# Patient Record
Sex: Female | Born: 2000 | Race: White | Hispanic: No | Marital: Single | Smoking: Never smoker
Health system: Southern US, Community
[De-identification: ages and names within clinical notes are randomized; demographics above are authoritative.]

## PROBLEM LIST (undated history)

## (undated) HISTORY — PX: WRIST SURGERY: SHX841

---
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
## 2019-01-08 DIAGNOSIS — Z01818 Encounter for other preprocedural examination: Secondary | ICD-10-CM | POA: Diagnosis not present

## 2019-01-19 DIAGNOSIS — S52612K Displaced fracture of left ulna styloid process, subsequent encounter for closed fracture with nonunion: Secondary | ICD-10-CM | POA: Diagnosis not present

## 2019-01-21 DIAGNOSIS — S52612K Displaced fracture of left ulna styloid process, subsequent encounter for closed fracture with nonunion: Secondary | ICD-10-CM | POA: Diagnosis not present

## 2019-01-28 DIAGNOSIS — S52612K Displaced fracture of left ulna styloid process, subsequent encounter for closed fracture with nonunion: Secondary | ICD-10-CM | POA: Diagnosis not present

## 2019-02-05 DIAGNOSIS — S52612K Displaced fracture of left ulna styloid process, subsequent encounter for closed fracture with nonunion: Secondary | ICD-10-CM | POA: Diagnosis not present

## 2019-02-11 DIAGNOSIS — S52612K Displaced fracture of left ulna styloid process, subsequent encounter for closed fracture with nonunion: Secondary | ICD-10-CM | POA: Diagnosis not present

## 2019-03-01 NOTE — Patient Instructions (Signed)
I value your feedback and entrusting us with your care. If you get a [facility] patient survey, I would appreciate you taking the time to let us know about your experience today. Thank you! 

## 2019-03-01 NOTE — Progress Notes (Signed)
System, Pcp Not In   Chief Complaint  Patient presents with  . Contraception    interested in IUD    HPI:      Ms. Crystal Cochran is a 18 y.o. No obstetric history on file. who LMP was Patient's last menstrual period was 02/09/2019 (exact date)., [facility], pt wants IUD. Menses are monthly, lasting 5-6 days, mod flow, no BTB, mild dysmen, improved with ibup. Pt did OCPs in past without side effects but wants non-daily method. Stopped them about 2/20. Pt is not currently sex active but has been in past. No recent STD testing, no hx of STDs in past.  No hx of HTN, DVTs, migraines with aura.   History reviewed. No pertinent past medical history.  Past Surgical History:  Procedure Laterality Date  . WRIST SURGERY      History reviewed. No pertinent family history.  Social History   Socioeconomic History  . Marital status: Single    Spouse name: Not on file  . Number of children: Not on file  . Years of education: Not on file  . Highest education level: Not on file  Occupational History  . Not on file  Social Needs  . Financial resource strain: Not on file  . Food insecurity    Worry: Not on file    Inability: Not on file  . Transportation needs    Medical: Not on file    Non-medical: Not on file  Tobacco Use  . Smoking status: Never Smoker  . Smokeless tobacco: Never Used  Substance and Sexual Activity  . Alcohol use: Yes  . Drug use: Never  . Sexual activity: Not Currently    Birth control/protection: None  Lifestyle  . Physical activity    Days per week: Not on file    Minutes per session: Not on file  . Stress: Not on file  Relationships  . Social Musicianconnections    Talks on phone: Not on file    Gets together: Not on file    Attends religious service: Not on file    Active member of club or organization: Not on file    Attends meetings of clubs or organizations: Not on file    Relationship status: Not on file  . Intimate partner  violence    Fear of current or ex partner: Not on file    Emotionally abused: Not on file    Physically abused: Not on file    Forced sexual activity: Not on file  Other Topics Concern  . Not on file  Social History Narrative  . Not on file    No outpatient medications prior to visit.   No facility-administered medications prior to visit.       ROS:  Review of Systems  Constitutional: Negative for fatigue, fever and unexpected weight change.  Respiratory: Negative for cough, shortness of breath and wheezing.   Cardiovascular: Negative for chest pain, palpitations and leg swelling.  Gastrointestinal: Negative for blood in stool, constipation, diarrhea, nausea and vomiting.  Endocrine: Negative for cold intolerance, heat intolerance and polyuria.  Genitourinary: Negative for dyspareunia, dysuria, flank pain, frequency, genital sores, hematuria, menstrual problem, pelvic pain, urgency, vaginal bleeding, vaginal discharge and vaginal pain.  Musculoskeletal: Negative for back pain, joint swelling and myalgias.  Skin: Negative for rash.  Neurological: Negative for dizziness, syncope, light-headedness, numbness and headaches.  Hematological: Negative for adenopathy.  Psychiatric/Behavioral: Negative for agitation, confusion, sleep disturbance and suicidal ideas. The  patient is not nervous/anxious.   BREAST: No symptoms   OBJECTIVE:   Vitals:  BP 90/60   Ht 5' 10.5" (1.791 m)   Wt 139 lb (63 kg)   LMP 02/09/2019 (Exact Date)   BMI 19.66 kg/m   Physical Exam Vitals signs reviewed.  Constitutional:      Appearance: She is well-developed.  Neck:     Musculoskeletal: Normal range of motion.  Pulmonary:     Effort: Pulmonary effort is normal.  Genitourinary:    General: Normal vulva.     Pubic Area: No rash.      Labia:        Right: No rash, tenderness or lesion.        Left: No rash, tenderness or lesion.      Vagina: Normal. No vaginal discharge, erythema or  tenderness.     Cervix: Normal.     Uterus: Normal. Not enlarged and not tender.      Adnexa: Right adnexa normal and left adnexa normal.       Right: No mass or tenderness.         Left: No mass or tenderness.    Musculoskeletal: Normal range of motion.  Skin:    General: Skin is warm and dry.  Neurological:     General: No focal deficit present.     Mental Status: She is alert and oriented to person, place, and time.  Psychiatric:        Mood and Affect: Mood normal.        Behavior: Behavior normal.        Thought Content: Thought content normal.        Judgment: Judgment normal.     Assessment/Plan: Encounter for initial prescription of intrauterine contraceptive device (IUD) - Plan: misoprostol (CYTOTEC) 100 MCG tablet; IUD discussed, Kyleena handout given. RTO with menses for insertion. Rx cytotec/NSAIDs 1 hr before appt.  Screening for STD (sexually transmitted disease) - Plan: Cervicovaginal ancillary only    Meds ordered this encounter  Medications  . misoprostol (CYTOTEC) 100 MCG tablet    Sig: Take 1 tablet (100 mcg total) by mouth once for 1 dose. 1 hour before appt    Dispense:  1 tablet    Refill:  0    Order Specific Question:   Supervising Provider    Answer:   Gae Dry [[id number]]      Return if symptoms worsen or fail to improve./with menses for IUD insertion   B. , PA-C 03/02/2019 9:53 AM

## 2019-03-02 ENCOUNTER — Other Ambulatory Visit: Payer: Self-pay

## 2019-03-02 ENCOUNTER — Encounter: Payer: Self-pay | Admitting: Obstetrics and Gynecology

## 2019-03-02 ENCOUNTER — Other Ambulatory Visit (HOSPITAL_COMMUNITY)
Admission: RE | Admit: 2019-03-02 | Discharge: 2019-03-02 | Disposition: A | Discharge status: Home / Self Care | Payer: BC Managed Care – PPO | Source: Ambulatory Visit | Attending: Obstetrics and Gynecology | Admitting: Obstetrics and Gynecology

## 2019-03-02 ENCOUNTER — Ambulatory Visit (INDEPENDENT_AMBULATORY_CARE_PROVIDER_SITE_OTHER): Payer: BC Managed Care – PPO | Admitting: Obstetrics and Gynecology

## 2019-03-02 VITALS — BP 90/60 | Ht 70.5 in | Wt 139.0 lb

## 2019-03-02 DIAGNOSIS — Z113 Encounter for screening for infections with a predominantly sexual mode of transmission: Secondary | ICD-10-CM

## 2019-03-02 DIAGNOSIS — Z30014 Encounter for initial prescription of intrauterine contraceptive device: Secondary | ICD-10-CM

## 2019-03-02 MED ORDER — MISOPROSTOL 100 MCG PO TABS: 100.0000 ug | ORAL_TABLET | Freq: Once | ORAL | 0 refills | Status: DC

## 2019-03-04 LAB — CERVICOVAGINAL ANCILLARY ONLY
Chlamydia: NEGATIVE
Neisseria Gonorrhea: NEGATIVE

## 2019-03-10 DIAGNOSIS — S52615K Nondisplaced fracture of left ulna styloid process, subsequent encounter for closed fracture with nonunion: Secondary | ICD-10-CM | POA: Diagnosis not present

## 2019-03-10 DIAGNOSIS — M6281 Muscle weakness (generalized): Secondary | ICD-10-CM | POA: Diagnosis not present

## 2019-03-10 DIAGNOSIS — M25532 Pain in left wrist: Secondary | ICD-10-CM | POA: Diagnosis not present

## 2019-03-11 ENCOUNTER — Telehealth: Payer: Self-pay | Admitting: Obstetrics and Gynecology

## 2019-03-11 NOTE — Telephone Encounter (Signed)
Patient is schedule for Crystal Cochran insertion 03/15/19 with [facility]

## 2019-03-13 NOTE — Progress Notes (Signed)
   Chief Complaint  Patient presents with  . Contraception    Kyleena insertion     IUD PROCEDURE NOTE:  Crystal Cochran is a 18 y.o. [id number] here for [facility]  IUD insertion for [facility].  BP 120/70   Ht 5\' 11"  (1.803 m)   Wt 145 lb (65.8 kg)   LMP 03/11/2019 (Exact Date)   BMI 20.22 kg/m   IUD Insertion Procedure Note Patient identified, informed consent performed, consent signed.   Discussed risks of irregular bleeding, cramping, infection, malpositioning or misplacement of the IUD outside the uterus which may require further procedure such as laparoscopy, risk of failure <1%. Time out was performed.    Speculum placed in the vagina.  Cervix visualized.  Cleaned with Betadine x 2.  Grasped anteriorly with a single tooth tenaculum.  Uterus sounded to 7.0 cm.   IUD placed per manufacturer's recommendations.  Strings trimmed to 3 cm. Tenaculum was removed, good hemostasis noted.  Patient tolerated procedure well.   ASSESSMENT:  Encounter for IUD insertion - Plan: Levonorgestrel (KYLEENA) 19.5 MG IUD   Meds ordered this encounter  Medications  . Levonorgestrel (KYLEENA) 19.5 MG IUD    Sig: 1 each (19.5 mg total) by Intrauterine route once for 1 dose.    Dispense:  1 Intra Uterine Device    Refill:  0    Order Specific Question:   Supervising Provider    Answer:   Gae Dry [[id number]]     Plan:  Patient was given post-procedure instructions.  She was advised to have backup contraception for one week.   Call if you are having increasing pain, cramps or bleeding or if you have a fever greater than 100.4 degrees F., shaking chills, nausea or vomiting. Patient was also asked to check IUD strings periodically and follow up in 4 weeks for IUD check.  Return in about 4 weeks (around 04/12/2019).  Alicia B. Copland, PA-C 03/15/2019 11:17 AM

## 2019-03-15 ENCOUNTER — Encounter: Payer: Self-pay | Admitting: Obstetrics and Gynecology

## 2019-03-15 ENCOUNTER — Other Ambulatory Visit: Payer: Self-pay

## 2019-03-15 ENCOUNTER — Ambulatory Visit (INDEPENDENT_AMBULATORY_CARE_PROVIDER_SITE_OTHER): Payer: BC Managed Care – PPO | Admitting: Obstetrics and Gynecology

## 2019-03-15 VITALS — BP 120/70 | Ht 71.0 in | Wt 145.0 lb

## 2019-03-15 DIAGNOSIS — Z30430 Encounter for insertion of intrauterine contraceptive device: Secondary | ICD-10-CM | POA: Diagnosis not present

## 2019-03-15 MED ORDER — KYLEENA 19.5 MG IU IUD: 19.5000 mg | INTRAUTERINE_SYSTEM | Freq: Once | INTRAUTERINE | 0 refills | Status: DC

## 2019-03-15 NOTE — Patient Instructions (Signed)
I value your feedback and entrusting us with your care. If you get a [facility] patient survey, I would appreciate you taking the time to let us know about your experience today. Thank you!  [facility] [phone]  Instructions after IUD insertion  Most women experience no significant problems after insertion of an IUD, however minor cramping and spotting for a few days is common. Cramps may be treated with ibuprofen 800mg every 8 hours or Tylenol 650 mg every 4 hours. Contact [facility] immediately if you experience any of the following symptoms during the next week: temperature >99.6 degrees, worsening pelvic pain, abdominal pain, fainting, unusually heavy vaginal bleeding, foul vaginal discharge, or if you think you have expelled the IUD.  Nothing inserted in the vagina for 48 hours. You will be scheduled for a follow up visit in approximately four weeks.  You should check monthly to be sure you can feel the IUD strings in the upper vagina. If you are having a monthly period, try to check after each period. If you cannot feel the IUD strings,  contact [facility] immediately so we can do an exam to determine if the IUD has been expelled.   Please use backup protection until we can confirm the IUD is in place.  Call [facility] if you are exposed to or diagnosed with a sexually transmitted infection, as we will need to discuss whether it is safe for you to continue using an IUD.   

## 2019-03-15 NOTE — Telephone Encounter (Signed)
Noted. Kyleena provided for this patient °

## 2019-03-16 DIAGNOSIS — S52615K Nondisplaced fracture of left ulna styloid process, subsequent encounter for closed fracture with nonunion: Secondary | ICD-10-CM | POA: Diagnosis not present

## 2019-03-16 DIAGNOSIS — M6281 Muscle weakness (generalized): Secondary | ICD-10-CM | POA: Diagnosis not present

## 2019-03-16 DIAGNOSIS — M25561 Pain in right knee: Secondary | ICD-10-CM | POA: Diagnosis not present

## 2019-03-16 DIAGNOSIS — M25532 Pain in left wrist: Secondary | ICD-10-CM | POA: Diagnosis not present

## 2019-03-18 DIAGNOSIS — S52615K Nondisplaced fracture of left ulna styloid process, subsequent encounter for closed fracture with nonunion: Secondary | ICD-10-CM | POA: Diagnosis not present

## 2019-03-18 DIAGNOSIS — M6281 Muscle weakness (generalized): Secondary | ICD-10-CM | POA: Diagnosis not present

## 2019-03-18 DIAGNOSIS — M25532 Pain in left wrist: Secondary | ICD-10-CM | POA: Diagnosis not present

## 2019-03-18 DIAGNOSIS — M25561 Pain in right knee: Secondary | ICD-10-CM | POA: Diagnosis not present

## 2019-03-22 ENCOUNTER — Encounter
Admission: EM | Disposition: A | Discharge status: Home / Self Care | Payer: Self-pay | Source: Home / Self Care | Attending: Student in an Organized Health Care Education/Training Program

## 2019-03-22 ENCOUNTER — Other Ambulatory Visit: Payer: Self-pay

## 2019-03-22 ENCOUNTER — Ambulatory Visit
Admission: EM | Admit: 2019-03-22 | Discharge: 2019-03-22 | Disposition: A | Discharge status: Home / Self Care | Payer: BC Managed Care – PPO | Attending: Student in an Organized Health Care Education/Training Program | Admitting: Student in an Organized Health Care Education/Training Program

## 2019-03-22 ENCOUNTER — Emergency Department: Payer: BC Managed Care – PPO | Admitting: Anesthesiology

## 2019-03-22 ENCOUNTER — Emergency Department: Payer: BC Managed Care – PPO

## 2019-03-22 DIAGNOSIS — T18108A Unspecified foreign body in esophagus causing other injury, initial encounter: Secondary | ICD-10-CM | POA: Diagnosis not present

## 2019-03-22 DIAGNOSIS — R0989 Other specified symptoms and signs involving the circulatory and respiratory systems: Secondary | ICD-10-CM

## 2019-03-22 DIAGNOSIS — Z793 Long term (current) use of hormonal contraceptives: Secondary | ICD-10-CM | POA: Diagnosis not present

## 2019-03-22 DIAGNOSIS — Z20828 Contact with and (suspected) exposure to other viral communicable diseases: Secondary | ICD-10-CM | POA: Diagnosis not present

## 2019-03-22 DIAGNOSIS — T18198A Other foreign object in esophagus causing other injury, initial encounter: Secondary | ICD-10-CM | POA: Diagnosis not present

## 2019-03-22 DIAGNOSIS — T18128A Food in esophagus causing other injury, initial encounter: Secondary | ICD-10-CM | POA: Diagnosis not present

## 2019-03-22 DIAGNOSIS — X58XXXA Exposure to other specified factors, initial encounter: Secondary | ICD-10-CM | POA: Insufficient documentation

## 2019-03-22 HISTORY — PX: FOREIGN BODY REMOVAL ESOPHAGEAL: SHX5322

## 2019-03-22 HISTORY — PX: RIGID ESOPHAGOSCOPY: SHX5226

## 2019-03-22 LAB — SARS CORONAVIRUS 2 BY RT PCR (HOSPITAL ORDER, PERFORMED IN ~~LOC~~ HOSPITAL LAB): SARS Coronavirus 2: NEGATIVE

## 2019-03-22 LAB — POCT PREGNANCY, URINE: Preg Test, Ur: NEGATIVE

## 2019-03-22 SURGERY — ESOPHAGOSCOPY, RIGID
Anesthesia: General

## 2019-03-22 MED ORDER — SUCCINYLCHOLINE CHLORIDE 20 MG/ML IJ SOLN
INTRAMUSCULAR | Status: DC | PRN
  Administered 2019-03-22: 140 mg via INTRAVENOUS

## 2019-03-22 MED ORDER — LIDOCAINE VISCOUS HCL 2 % MT SOLN
15.0000 mL | Freq: Once | OROMUCOSAL | Status: AC
  Administered 2019-03-22: 09:00:00 15 mL via OROMUCOSAL
  Filled 2019-03-22: qty 15

## 2019-03-22 MED ORDER — DEXAMETHASONE SODIUM PHOSPHATE 10 MG/ML IJ SOLN
INTRAMUSCULAR | Status: DC | PRN
  Administered 2019-03-22: 10 mg via INTRAVENOUS

## 2019-03-22 MED ORDER — PROPOFOL 10 MG/ML IV BOLUS
INTRAVENOUS | Status: AC
  Filled 2019-03-22: qty 20

## 2019-03-22 MED ORDER — MORPHINE SULFATE (PF) 4 MG/ML IV SOLN
4.0000 mg | INTRAVENOUS | Status: DC | PRN
  Administered 2019-03-22: 4 mg via INTRAVENOUS
  Filled 2019-03-22: qty 1

## 2019-03-22 MED ORDER — ROCURONIUM BROMIDE 100 MG/10ML IV SOLN
INTRAVENOUS | Status: DC | PRN
  Administered 2019-03-22: 10 mg via INTRAVENOUS
  Administered 2019-03-22: 30 mg via INTRAVENOUS

## 2019-03-22 MED ORDER — FENTANYL CITRATE (PF) 100 MCG/2ML IJ SOLN
INTRAMUSCULAR | Status: DC | PRN
  Administered 2019-03-22 (×2): 50 ug via INTRAVENOUS

## 2019-03-22 MED ORDER — SUGAMMADEX SODIUM 200 MG/2ML IV SOLN
INTRAVENOUS | Status: DC | PRN
  Administered 2019-03-22: 118 mg via INTRAVENOUS

## 2019-03-22 MED ORDER — ONDANSETRON HCL 4 MG/2ML IJ SOLN
4.0000 mg | Freq: Once | INTRAMUSCULAR | Status: AC
  Administered 2019-03-22: 4 mg via INTRAVENOUS
  Filled 2019-03-22: qty 2

## 2019-03-22 MED ORDER — ONDANSETRON HCL 4 MG/2ML IJ SOLN: 4.0000 mg | Freq: Once | INTRAMUSCULAR | Status: DC | PRN

## 2019-03-22 MED ORDER — LIDOCAINE HCL (CARDIAC) PF 100 MG/5ML IV SOSY
PREFILLED_SYRINGE | INTRAVENOUS | Status: DC | PRN
  Administered 2019-03-22: 80 mg via INTRAVENOUS

## 2019-03-22 MED ORDER — ONDANSETRON HCL 4 MG/2ML IJ SOLN
INTRAMUSCULAR | Status: DC | PRN
  Administered 2019-03-22: 4 mg via INTRAVENOUS

## 2019-03-22 MED ORDER — PROPOFOL 10 MG/ML IV BOLUS
INTRAVENOUS | Status: DC | PRN
  Administered 2019-03-22: 150 mg via INTRAVENOUS

## 2019-03-22 MED ORDER — FENTANYL CITRATE (PF) 100 MCG/2ML IJ SOLN
INTRAMUSCULAR | Status: AC
  Administered 2019-03-22: 25 ug via INTRAVENOUS
  Filled 2019-03-22: qty 2

## 2019-03-22 MED ORDER — LIDOCAINE HCL (PF) 2 % IJ SOLN
INTRAMUSCULAR | Status: AC
  Filled 2019-03-22: qty 10

## 2019-03-22 MED ORDER — MIDAZOLAM HCL 2 MG/2ML IJ SOLN
INTRAMUSCULAR | Status: DC | PRN
  Administered 2019-03-22: 2 mg via INTRAVENOUS

## 2019-03-22 MED ORDER — FENTANYL CITRATE (PF) 100 MCG/2ML IJ SOLN
INTRAMUSCULAR | Status: AC
  Filled 2019-03-22: qty 2

## 2019-03-22 MED ORDER — FENTANYL CITRATE (PF) 100 MCG/2ML IJ SOLN
25.0000 ug | INTRAMUSCULAR | Status: DC | PRN
  Administered 2019-03-22 (×2): 25 ug via INTRAVENOUS

## 2019-03-22 MED ORDER — LACTATED RINGERS IV SOLN
INTRAVENOUS | Status: DC
  Administered 2019-03-22: 12:00:00 via INTRAVENOUS

## 2019-03-22 MED ORDER — MIDAZOLAM HCL 2 MG/2ML IJ SOLN
INTRAMUSCULAR | Status: AC
  Filled 2019-03-22: qty 2

## 2019-03-22 SURGICAL SUPPLY — 18 items
BASIN GRAD PLASTIC 32OZ STRL (MISCELLANEOUS) ×2 IMPLANT
CANISTER SUCT 1200ML W/VALVE (MISCELLANEOUS) ×4 IMPLANT
CNTNR SPEC 2.5X3XGRAD LEK (MISCELLANEOUS) ×2
CONT SPEC 4OZ STER OR WHT (MISCELLANEOUS) ×2
CONTAINER SPEC 2.5X3XGRAD LEK (MISCELLANEOUS) ×2 IMPLANT
COVER BACK TABLE REUSABLE LG (DRAPES) ×4 IMPLANT
COVER WAND RF STERILE (DRAPES) ×4 IMPLANT
DRAPE 3/4 80X56 (DRAPES) ×4 IMPLANT
GAUZE 4X4 16PLY RFD (DISPOSABLE) ×4 IMPLANT
GLOVE PROTEXIS LATEX SZ 7.5 (GLOVE) ×4 IMPLANT
GLOVE SURG LATEX 7.5 PF (GLOVE) ×2 IMPLANT
GOWN STRL REUS W/ TWL LRG LVL3 (GOWN DISPOSABLE) ×2 IMPLANT
GOWN STRL REUS W/TWL LRG LVL3 (GOWN DISPOSABLE) ×2
KIT TURNOVER KIT A (KITS) ×4 IMPLANT
SURGILUBE 2OZ TUBE FLIPTOP (MISCELLANEOUS) ×4 IMPLANT
TOWEL OR 17X26 4PK STRL BLUE (TOWEL DISPOSABLE) ×4 IMPLANT
TUBING CONNECTING 10 (TUBING) ×3 IMPLANT
TUBING CONNECTING 10' (TUBING) ×1

## 2019-03-22 NOTE — ED Triage Notes (Signed)
Patient took herbal supplement pill at 12 and it lodged in her throat. Patient reports that it has not moved and complains of pain to site. Patient able to speak in full sentences, however her voice is hoarse.

## 2019-03-22 NOTE — ED Notes (Signed)
4 oz ginger ale given to patient. 

## 2019-03-22 NOTE — Transfer of Care (Signed)
Immediate Anesthesia Transfer of Care Note  Patient: Crystal Cochran  Procedure(s) Performed: RIGID ESOPHAGOSCOPY (N/A ) REMOVAL FOREIGN BODY ESOPHAGEAL  Patient Location: PACU  Anesthesia Type:General  Level of Consciousness: awake and sedated  Airway & Oxygen Therapy: Patient Spontanous Breathing and Patient connected to face mask oxygen  Post-op Assessment: Report given to RN and Post -op Vital signs reviewed and stable  Post vital signs: Reviewed and stable  Last Vitals:  Vitals Value Taken Time  BP    Temp    Pulse 66 03/22/19 1249  Resp 10 03/22/19 1249  SpO2 100 % 03/22/19 1249  Vitals shown include unvalidated device data.  Last Pain:  Vitals:   03/22/19 1133  TempSrc: Tympanic  PainSc: 7          Complications: No apparent anesthesia complications

## 2019-03-22 NOTE — Anesthesia Procedure Notes (Signed)
Procedure Name: Intubation Date/Time: 03/22/2019 12:24 PM Performed by: Allean Found, CRNA Pre-anesthesia Checklist: Patient identified, Patient being monitored, Timeout performed, Emergency Drugs available and Suction available Patient Re-evaluated:Patient Re-evaluated prior to induction Oxygen Delivery Method: Circle system utilized Preoxygenation: Pre-oxygenation with 100% oxygen Induction Type: IV induction Ventilation: Mask ventilation without difficulty Laryngoscope Size: Mac, 3 and McGraph Grade View: Grade I Tube type: Oral Tube size: 7.0 mm Number of attempts: 1 Airway Equipment and Method: Stylet Placement Confirmation: ETT inserted through vocal cords under direct vision,  positive ETCO2 and breath sounds checked- equal and bilateral Secured at: 21 cm Tube secured with: Tape Dental Injury: Teeth and Oropharynx as per pre-operative assessment

## 2019-03-22 NOTE — Anesthesia Post-op Follow-up Note (Signed)
Anesthesia QCDR form completed.        

## 2019-03-22 NOTE — Anesthesia Preprocedure Evaluation (Signed)
Anesthesia Evaluation  Patient identified by MRN, date of birth, ID band Patient awake    Reviewed: Allergy & Precautions, H&P , NPO status , Patient's Chart, lab work & pertinent test results, reviewed documented beta blocker date and time   Airway Mallampati: II  TM Distance: >3 FB Neck ROM: full    Dental  (+) Teeth Intact   Pulmonary neg pulmonary ROS,    Pulmonary exam normal        Cardiovascular Exercise Tolerance: Good negative cardio ROS Normal cardiovascular exam Rhythm:regular Rate:Normal     Neuro/Psych negative neurological ROS  negative psych ROS   GI/Hepatic negative GI ROS, Neg liver ROS,   Endo/Other  negative endocrine ROS  Renal/GU negative Renal ROS  negative genitourinary   Musculoskeletal   Abdominal   Peds  Hematology negative hematology ROS (+)   Anesthesia Other Findings History reviewed. No pertinent past medical history. Past Surgical History: No date: WRIST SURGERY BMI    Body Mass Index: 18.13 kg/m     Reproductive/Obstetrics negative OB ROS                             Anesthesia Physical Anesthesia Plan  ASA: II and emergent  Anesthesia Plan: General ETT   Post-op Pain Management:    Induction:   PONV Risk Score and Plan:   Airway Management Planned:   Additional Equipment:   Intra-op Plan:   Post-operative Plan:   Informed Consent: I have reviewed the patients History and Physical, chart, labs and discussed the procedure including the risks, benefits and alternatives for the proposed anesthesia with the patient or authorized representative who has indicated his/her understanding and acceptance.     Dental Advisory Given  Plan Discussed with: CRNA  Anesthesia Plan Comments:         Anesthesia Quick Evaluation

## 2019-03-22 NOTE — ED Notes (Signed)
Tolerating po well.  Applesauce given to patient.

## 2019-03-22 NOTE — ED Notes (Signed)
Drinking ginger ale.  Continues to c/o pain when swallowing, but states she feels like the pill has moved a little.  Continues to manage secretions.  Voice strong.  Respirations regular and non labored.

## 2019-03-22 NOTE — Discharge Instructions (Signed)

## 2019-03-22 NOTE — H&P (Signed)
Crystal Cochran is an 18 y.o. female.   Chief Complaint: Pill stuck in her throat HPI: Patient is a 18 year old white female who is been very healthy in general.  She said sometimes were occasionally food would feel like it sticks a little bit in her throat with solids going down.  She ate a very large herbal supplement last night that is a huge pill.  This got stuck in her cervical esophagus and has not moved.  She tried some liquids this morning and could not get it to pass.  CT scan showed a mass in her cervical esophagus.  History reviewed. No pertinent past medical history.  Past Surgical History:  Procedure Laterality Date  . WRIST SURGERY      History reviewed. No pertinent family history. Social History:  reports that she has never smoked. She has never used smokeless tobacco. She reports current alcohol use. She reports that she does not use drugs.  Allergies: No Known Allergies  Medications Prior to Admission  Medication Sig Dispense Refill  . Levonorgestrel (KYLEENA) 19.5 MG IUD 1 each (19.5 mg total) by Intrauterine route once for 1 dose. 1 Intra Uterine Device 0    Results for orders placed or performed during the hospital encounter of 03/22/19 (from the past 48 hour(s))  SARS Coronavirus 2 [facility] order, Performed in [facility] hospital lab) Nasopharyngeal Nasopharyngeal Swab     Status: None   Collection Time: 03/22/19  7:27 AM   Specimen: Nasopharyngeal Swab  Result Value Ref Range   SARS Coronavirus 2 NEGATIVE NEGATIVE    Comment: (NOTE) If result is NEGATIVE SARS-CoV-2 target nucleic acids are NOT DETECTED. The SARS-CoV-2 RNA is generally detectable in upper and lower  respiratory specimens during the acute phase of infection. The lowest  concentration of SARS-CoV-2 viral copies this assay can detect is 250  copies / mL. A negative result does not preclude SARS-CoV-2 infection  and should not be used as the sole basis for treatment or other  patient management  decisions.  A negative result may occur with  improper specimen collection / handling, submission of specimen other  than nasopharyngeal swab, presence of viral mutation(s) within the  areas targeted by this assay, and inadequate number of viral copies  (<250 copies / mL). A negative result must be combined with clinical  observations, patient history, and epidemiological information. If result is POSITIVE SARS-CoV-2 target nucleic acids are DETECTED. The SARS-CoV-2 RNA is generally detectable in upper and lower  respiratory specimens dur ing the acute phase of infection.  Positive  results are indicative of active infection with SARS-CoV-2.  Clinical  correlation with patient history and other diagnostic information is  necessary to determine patient infection status.  Positive results do  not rule out bacterial infection or co-infection with other viruses. If result is PRESUMPTIVE POSTIVE SARS-CoV-2 nucleic acids MAY BE PRESENT.   A presumptive positive result was obtained on the submitted specimen  and confirmed on repeat testing.  While 2019 novel coronavirus  (SARS-CoV-2) nucleic acids may be present in the submitted sample  additional confirmatory testing may be necessary for epidemiological  and / or clinical management purposes  to differentiate between  SARS-CoV-2 and other Sarbecovirus currently known to infect humans.  If clinically indicated additional testing with an alternate test  methodology [id number]) is advised. The SARS-CoV-2 RNA is generally  detectable in upper and lower respiratory sp ecimens during the acute  phase of infection. The expected result is Negative. Fact Sheet for  Patients:  BoilerBrush.com.cyhttps://www.fda.gov/media/136312/download Fact Sheet for Healthcare Providers: https://pope.com/https://www.fda.gov/media/136313/download This test is not yet approved or cleared by the [place] FDA and has been authorized for detection and/or diagnosis of SARS-CoV-2 by FDA under an  Emergency Use Authorization (EUA).  This EUA will remain in effect (meaning this test can be used) for the duration of the COVID-19 declaration under Section 564(b)(1) of the Act, 21 U.S.C. section 360bbb-3(b)(1), unless the authorization is terminated or revoked sooner. Performed at [facility], [address] [place], [place] [address]   Pregnancy, urine POC     Status: None   Collection Time: 03/22/19 10:51 AM  Result Value Ref Range   Preg Test, Ur NEGATIVE NEGATIVE    Comment:        THE SENSITIVITY OF THIS METHODOLOGY IS >24 mIU/mL    Dg Neck Soft Tissue  Result Date: 03/22/2019 CLINICAL DATA:  Pill lodged in throat. EXAM: NECK SOFT TISSUES - 1+ VIEW COMPARISON:  No prior. FINDINGS: No swelling noted of the epiglottis or retropharyngeal space. No radiopaque foreign body. Tiny amount of air noted about the cervical esophagus, this may be within the esophagus. Paraesophageal air cannot be excluded. Trachea is widely patent. Pulmonary apices are clear. Loss of normal cervical lordosis. No acute bony abnormality. IMPRESSION: 1.  No radiopaque foreign body. 2. Tiny amount of air noted about the cervical esophagus, although this may be within the esophagus, paraesophageal air cannot be excluded. To exclude cervical esophageal perforation nonenhanced neck CT can be obtained. These results were called by telephone at the time of interpretation on 03/22/2019 at 6:30 am to provider [facility] , who verbally acknowledged these results. Electronically Signed   By: Maisie Fushomas  Register   On: 03/22/2019 06:29   Ct Soft Tissue Neck Wo Contrast  Result Date: 03/22/2019 CLINICAL DATA:  Ingested foreign body with possible esophageal perforation on x-ray. EXAM: CT NECK WITHOUT CONTRAST TECHNIQUE: Multidetector CT imaging of the neck was performed following the standard protocol without intravenous contrast. COMPARISON:  Earlier today FINDINGS: Pharynx and larynx: Lamellated high-density foreign  body in the upper cervical esophagus, just below the verge, measuring 17 x 19 x 7 mm. Location is un moved from prior radiograph. No perforation or adjacent mural thickening. No edema seen within the pharynx or larynx. Salivary glands: Normal Thyroid: Normal Lymph nodes: None enlarged or abnormal density. Vascular: Negative. Limited intracranial: Negative. Visualized orbits: Negative. Mastoids and visualized paranasal sinuses: Minimal paranasal sinus opacification at the level of the ethmoids. Skeleton: Negative Upper chest: Negative IMPRESSION: 19 x 17 x 7 mm foreign body in the upper cervical esophagus without perforation or esophageal thickening. The foreign body shows a stone-like high-density and lamellated appearance. Electronically Signed   By: Marnee SpringJonathon  Watts M.D.   On: 03/22/2019 07:30    ROS   Blood pressure 118/65, pulse 65, temperature (!) 97.5 F (36.4 C), temperature source Tympanic, resp. rate 18, height 5\' 11"  (1.803 m), weight 59 kg, last menstrual period 03/11/2019, SpO2 99 %. Physical Exam her oropharynx shows no oral lesions.  The neck is negative for any masses.  She has no thyromegaly.  Her lungs are clear to auscultation.  Her heart shows a regular rate and rhythm.  Her abdomen is benign.  Her extremities are without deformity.  Assessment/Plan She has esophageal obstruction from a very large pill that has not resolved.  We will plan an esophagoscopy and removal of foreign body.  This may break into pieces and slide down which would  be sufficient as well.  The patient understands the procedure and the potential risks.  She had some liquids earlier this morning and tried little applesauce to get it to slide.  It is been approximately 2 hours since she last took any liquids in.  This increases the risk of anesthesia for possible aspiration but I feel it is important to proceed ahead and an emergent basis to try to get this removed.  The longer the pill stays there and dissolves the  more irritation to the mucosa and swelling that will occur.  I think the risk of aspiration is very minimal compared to the risk to the esophageal mucosa.  I have discussed this with the patient and we will proceed with general anesthesia and esophagoscopy for removal of the foreign body.  She has no further questions.  Informed surgical request is signed.  Cammy Copa, MD 03/22/2019, 11:46 AM

## 2019-03-22 NOTE — ED Provider Notes (Signed)
[facility] Emergency Department Provider Note  ____________________________________________   First MD Initiated Contact with Patient 03/22/19 [phone]     (approximate)  I have reviewed the triage vital signs and the nursing notes.   HISTORY  Chief Complaint Foreign Body (throat)    HPI Sequoyah Piester is a 18 y.o. female with no chronic medical issues who is generally healthy and active and a Consulting civil engineer at [employer].   She presents for evaluation of acute onset discomfort and difficulty swallowing after taking an herbal supplement at about midnight.  She said that is since she took it she felt it lodged in her throat and is remained stable and constant since that time.  Her voice is little bit hoarse and she is able to swallow her secretions but it is painful to do so.  She reports that the pain is anywhere from moderate to severe and she is able to lay flat but she feels like she has to sit up and spit out her saliva at times to keep from swallowing it.  Nothing particular makes her symptoms better.  She denies any pain down into her chest and was completely asymptomatic prior to taking the supplement.  No contact with known COVID-19 patients.  She denies fever/chills, chest pain, shortness of breath, cough, nausea, vomiting, and abdominal pain.  She has had no swelling of the anterior part of her neck.  Denies drug use.        History reviewed. No pertinent past medical history.  There are no active problems to display for this patient.   Past Surgical History:  Procedure Laterality Date  . WRIST SURGERY      Prior to Admission medications   Medication Sig Start Date End Date Taking? Authorizing Provider  Levonorgestrel (KYLEENA) 19.5 MG IUD 1 each (19.5 mg total) by Intrauterine route once for 1 dose. 03/15/19 03/15/19  Copland, Ilona Sorrel, PA-C    Allergies Patient has no known allergies.  No family history on file.  Social History Social History    Tobacco Use  . Smoking status: Never Smoker  . Smokeless tobacco: Never Used  Substance Use Topics  . Alcohol use: Yes  . Drug use: Never    Review of Systems Constitutional: No fever/chills Eyes: No visual changes. ENT: Sore throat and foreign body sensation. Cardiovascular: Denies chest pain. Respiratory: Denies shortness of breath. Gastrointestinal: No abdominal pain.  No nausea, no vomiting.  No diarrhea.  No constipation. Genitourinary: Negative for dysuria. Musculoskeletal: Negative for neck pain.  Negative for back pain. Integumentary: Negative for rash. Neurological: Negative for headaches, focal weakness or numbness.   ____________________________________________   PHYSICAL EXAM:  VITAL SIGNS: ED Triage Vitals  Enc Vitals Group     BP 03/22/19 0542 125/79     Pulse Rate 03/22/19 0542 87     Resp 03/22/19 0542 16     Temp 03/22/19 0542 98.1 F (36.7 C)     Temp src --      SpO2 03/22/19 0542 100 %     Weight 03/22/19 0538 59 kg (130 lb)     Height 03/22/19 0538 1.803 m (5\' 11" )     Head Circumference --      Peak Flow --      Pain Score 03/22/19 0538 9     Pain Loc --      Pain Edu? --      Excl. in GC? --     Constitutional: Alert and oriented.  Generally well-appearing but does appear uncomfortable. Eyes: Conjunctivae are normal.  Head: Atraumatic. Nose: No congestion/rhinnorhea. Mouth/Throat: Mucous membranes are moist and nonerythematous.  No obvious foreign bodies or evidence of acute abnormality on visual inspection. Neck: No stridor.  No meningeal signs.  No anterior neck swelling nor crepitus.  No tenderness to manipulation of the larynx. Cardiovascular: Normal rate, regular rhythm. Good peripheral circulation. Grossly normal heart sounds. Respiratory: Normal respiratory effort.  No retractions. Gastrointestinal: Soft and nontender. No distention.  Musculoskeletal: No lower extremity tenderness nor edema. No gross deformities of extremities.  Neurologic:  Normal speech and language. No gross focal neurologic deficits are appreciated.  Skin:  Skin is warm, dry and intact. Psychiatric: Mood and affect are normal. Speech and behavior are normal.  ____________________________________________   LABS (all labs ordered are listed, but only abnormal results are displayed)  Labs Reviewed - No data to display ____________________________________________  EKG  No indication for EKG ____________________________________________  RADIOLOGY I, Hinda Kehr, personally discussed these images and results by phone with the on-call radiologist and used this discussion as part of my medical decision making.    ED MD interpretation: Small area of air around cervical esophagus concerning for possible esophageal perforation.  The radiologist called to discuss the case with me and recommended following up with a noncontrast soft tissue neck CT scan.  CT pending at the time of transfer of care to Dr. Quentin Cornwall.  Official radiology report(s): Dg Neck Soft Tissue  Result Date: 03/22/2019 CLINICAL DATA:  Pill lodged in throat. EXAM: NECK SOFT TISSUES - 1+ VIEW COMPARISON:  No prior. FINDINGS: No swelling noted of the epiglottis or retropharyngeal space. No radiopaque foreign body. Tiny amount of air noted about the cervical esophagus, this may be within the esophagus. Paraesophageal air cannot be excluded. Trachea is widely patent. Pulmonary apices are clear. Loss of normal cervical lordosis. No acute bony abnormality. IMPRESSION: 1.  No radiopaque foreign body. 2. Tiny amount of air noted about the cervical esophagus, although this may be within the esophagus, paraesophageal air cannot be excluded. To exclude cervical esophageal perforation nonenhanced neck CT can be obtained. These results were called by telephone at the time of interpretation on 03/22/2019 at 6:30 am to provider [facility] , who verbally acknowledged these results. Electronically  Signed   By: Marcello Moores  Register   On: 03/22/2019 06:29    ____________________________________________   PROCEDURES   Procedure(s) performed (including Critical Care):  Procedures   ____________________________________________   INITIAL IMPRESSION / MDM / [place] / ED COURSE  As part of my medical decision making, I reviewed the following data within the [place] notes reviewed and incorporated, Patient signed out to Dr. Quentin Cornwall, Discussed with radiologist and Notes from prior ED visits   Differential diagnosis includes, but is not limited to, globus sensation, retained foreign body in the esophagus or trachea, esophageal perforation.  The patient is handling her secretions but is obviously uncomfortable.  Vital signs are stable.  No indication for emergent airway intervention.  I ordered soft tissue neck x-rays and the radiologist called me to say that there is an area of air in the tissue that is concerning for possible esophageal perforation.  He recommended a noncontrast neck CT for further evaluation.  I updated the patient.  No indication for labs at this time until we have a better idea if this is a benign finding or if it represents an emergency.  Transferring ED care to Dr. Quentin Cornwall  to follow-up the CT scan and disposition the patient appropriately.          ____________________________________________  FINAL CLINICAL IMPRESSION(S) / ED DIAGNOSES  Final diagnoses:  Sensation of foreign body in throat     MEDICATIONS GIVEN DURING THIS VISIT:  Medications - No data to display   ED Discharge Orders    None      *Please note:  Kathryne ErikssonSmythe Narayan was evaluated in Emergency Department on 03/22/2019 for the symptoms described in the history of present illness. She was evaluated in the context of the global COVID-19 pandemic, which necessitated consideration that the patient might be at risk for infection with the SARS-CoV-2 virus  that causes COVID-19. Institutional protocols and algorithms that pertain to the evaluation of patients at risk for COVID-19 are in a state of rapid change based on information released by regulatory bodies including the CDC and federal and state organizations. These policies and algorithms were followed during the patient's care in the ED.  Some ED evaluations and interventions may be delayed as a result of limited staffing during the pandemic.*  Note:  This document was prepared using Dragon voice recognition software and may include unintentional dictation errors.   Loleta RoseForbach, Eryanna Regal, MD 03/22/19 [phone]

## 2019-03-22 NOTE — Op Note (Signed)
03/22/2019  12:47 PM    Crystal Cochran  [id number]   Pre-Op Dx: Foreign body of the esophagus causing obstruction  Post-op Dx: Foreign body the esophagus because obstruction and mucosal burn  Proc: Rigid esophagoscopy for removal of foreign body  Surg:  Huey Romans  Anes:  GOT  EBL: None  Comp: None  Findings: Most of the pill had dissolved but the mucosa was quite irritated all the way around the esophagus with some residue the pill left over that was suctioned away.  Procedure: The patient was given general anesthesia by oral endotracheal intubation.  A short 10 x 14 mm esophagoscope was used for direct visualization of the esophagus.  A upper tooth guard was placed in the esophagoscope was placed into the mouth and into the back of the throat.  There is no signs of irritation here.  I went behind the arytenoids into the upper esophagus.  The sphincter was normal.  Passed out approximately 3 more centimeters and you could see pill residue in the opening of the esophagus.  I suctioned some of this way and you could see mucosal irritation where the residue was sitting against the mucosa.  I went down to the level of mucosal irritation so I could see distally.  The mucosa further down was clear.  I chose not to push through this irritated mucosa to make sure I did not create any irritation or potential tears here.  I cleaned out most of the residue.  Pictures were taken to show the irritation mucosa and where the residue was removed.  The scope was removed and the teeth were checked and there is no injury to the teeth.  Patient tolerated the procedure well.  She was awakened taken to the recovery room in satisfactory condition.  There were no operative complications.  Dispo:   To PACU to be discharged home  Plan: I spoke with mom who lives in [place].  Patient will be on a liquid diet with soft foods for the next 3 to 5 days.  She is a vegetarian.  She will make sure that any  food she eats is chewn up very well.  If after couple weeks she still feels like she has stricture or tightening there then mom will get arranged for her to have an upper GI when she comes home for Christmas break.  She can stay on liquid diets with protein shakes as necessary for nutrition if she needs to.  She does not need a specific visit to my office for follow-up as she should do well.  She can call if she has any questions or concerns.  Elon Alas Paulene Tayag  03/22/2019 12:47 PM

## 2019-03-22 NOTE — ED Provider Notes (Signed)
-----------------------------------------   7:55 AM on 03/22/2019 -----------------------------------------  Case was discussed in consultation with Dr. Ladene Artist of ear nose and throat.  Discussed presentation as well as imaging.  He currently recommends additional p.o. challenge to see if the pill has dissolved enough for passage of liquids.  Patient unable to tolerate secretions or liquids will consider for operative intervention.  ----------------------------------------- 9:44 AM on 03/22/2019 -----------------------------------------  Patient is tolerating clear liquids and secretions but still quite uncomfortable.  She does not seem to have any acute respiratory distress or airway involvement but I am concerned given report of "stonelike "foreign body in esophagus with persistent discomfort that this ought to be evaluated.  I discussed the case with Dr. Ladene Artist of ENT who kindly agrees to evaluate patient at bedside and likely endoscopy for foreign body removal   Merlyn Lot, MD 03/22/19 1047

## 2019-03-22 NOTE — ED Notes (Signed)
Report to jane, rn.  

## 2019-03-23 ENCOUNTER — Encounter: Payer: Self-pay | Admitting: Otolaryngology

## 2019-03-23 NOTE — Anesthesia Postprocedure Evaluation (Signed)
Anesthesia Post Note  Patient: Crystal Cochran  Procedure(s) Performed: RIGID ESOPHAGOSCOPY (N/A ) REMOVAL FOREIGN BODY ESOPHAGEAL  Patient location during evaluation: PACU Anesthesia Type: General Level of consciousness: awake and alert Pain management: pain level controlled Vital Signs Assessment: post-procedure vital signs reviewed and stable Respiratory status: spontaneous breathing, nonlabored ventilation, respiratory function stable and patient connected to nasal cannula oxygen Cardiovascular status: blood pressure returned to baseline and stable Postop Assessment: no apparent nausea or vomiting Anesthetic complications: no     Last Vitals:  Vitals:   03/22/19 1335 03/22/19 1353  BP: 122/73 114/68  Pulse: (!) 53 60  Resp: 16 16  Temp: (!) 36.1 C   SpO2: 100% (!) 10%    Last Pain:  Vitals:   03/22/19 1353  TempSrc:   PainSc: 3                  Molli Barrows

## 2019-03-30 DIAGNOSIS — M25532 Pain in left wrist: Secondary | ICD-10-CM | POA: Diagnosis not present

## 2019-03-30 DIAGNOSIS — M6281 Muscle weakness (generalized): Secondary | ICD-10-CM | POA: Diagnosis not present

## 2019-03-30 DIAGNOSIS — M25561 Pain in right knee: Secondary | ICD-10-CM | POA: Diagnosis not present

## 2019-03-30 DIAGNOSIS — S52615K Nondisplaced fracture of left ulna styloid process, subsequent encounter for closed fracture with nonunion: Secondary | ICD-10-CM | POA: Diagnosis not present

## 2019-04-01 DIAGNOSIS — M25532 Pain in left wrist: Secondary | ICD-10-CM | POA: Diagnosis not present

## 2019-04-01 DIAGNOSIS — S52615K Nondisplaced fracture of left ulna styloid process, subsequent encounter for closed fracture with nonunion: Secondary | ICD-10-CM | POA: Diagnosis not present

## 2019-04-01 DIAGNOSIS — M25561 Pain in right knee: Secondary | ICD-10-CM | POA: Diagnosis not present

## 2019-04-01 DIAGNOSIS — M6281 Muscle weakness (generalized): Secondary | ICD-10-CM | POA: Diagnosis not present

## 2019-04-06 DIAGNOSIS — M6281 Muscle weakness (generalized): Secondary | ICD-10-CM | POA: Diagnosis not present

## 2019-04-06 DIAGNOSIS — S52615K Nondisplaced fracture of left ulna styloid process, subsequent encounter for closed fracture with nonunion: Secondary | ICD-10-CM | POA: Diagnosis not present

## 2019-04-06 DIAGNOSIS — M25532 Pain in left wrist: Secondary | ICD-10-CM | POA: Diagnosis not present

## 2019-04-06 DIAGNOSIS — M25561 Pain in right knee: Secondary | ICD-10-CM | POA: Diagnosis not present

## 2019-04-08 DIAGNOSIS — S52615K Nondisplaced fracture of left ulna styloid process, subsequent encounter for closed fracture with nonunion: Secondary | ICD-10-CM | POA: Diagnosis not present

## 2019-04-08 DIAGNOSIS — M25561 Pain in right knee: Secondary | ICD-10-CM | POA: Diagnosis not present

## 2019-04-08 DIAGNOSIS — M6281 Muscle weakness (generalized): Secondary | ICD-10-CM | POA: Diagnosis not present

## 2019-04-08 DIAGNOSIS — M25532 Pain in left wrist: Secondary | ICD-10-CM | POA: Diagnosis not present

## 2019-04-08 NOTE — Progress Notes (Signed)
   Chief Complaint  Patient presents with  . IUD check     History of Present Illness:  Crystal Cochran is a 18 y.o. that had a [place] IUD placed approximately 1 month ago. Since that time, she denies dyspareunia, pelvic pain,  vaginal d/c, heavy bleeding. She has had light spotting off and on since insertion.  Review of Systems  Constitutional: Negative for fever.  Gastrointestinal: Negative for blood in stool, constipation, diarrhea, nausea and vomiting.  Genitourinary: Negative for dyspareunia, dysuria, flank pain, frequency, hematuria, urgency, vaginal bleeding, vaginal discharge and vaginal pain.  Musculoskeletal: Negative for back pain.  Skin: Negative for rash.    Physical Exam:  BP 110/70   Ht 5\' 11"  (1.803 m)   Wt 160 lb (72.6 kg)   BMI 22.32 kg/m  Body mass index is 22.32 kg/m.  Pelvic exam:  Two IUD strings present seen coming from the cervical os. EGBUS, vaginal vault and cervix: within normal limits   Assessment:   Encounter for routine checking of intrauterine contraceptive device (IUD)  IUD strings present in proper location; pt doing well  Plan: F/u if any signs of infection or can no longer feel the strings.   Alicia B. Copland, PA-C 04/12/2019 10:09 AM

## 2019-04-08 NOTE — Patient Instructions (Signed)
I value your feedback and entrusting us with your care. If you get a [place] patient survey, I would appreciate you taking the time to let us know about your experience today. Thank you! 

## 2019-04-12 ENCOUNTER — Other Ambulatory Visit: Payer: Self-pay

## 2019-04-12 ENCOUNTER — Encounter: Payer: Self-pay | Admitting: Obstetrics and Gynecology

## 2019-04-12 ENCOUNTER — Ambulatory Visit (INDEPENDENT_AMBULATORY_CARE_PROVIDER_SITE_OTHER): Payer: BC Managed Care – PPO | Admitting: Obstetrics and Gynecology

## 2019-04-12 VITALS — BP 110/70 | Ht 71.0 in | Wt 160.0 lb

## 2019-04-12 DIAGNOSIS — Z30431 Encounter for routine checking of intrauterine contraceptive device: Secondary | ICD-10-CM | POA: Diagnosis not present

## 2019-04-13 DIAGNOSIS — M6281 Muscle weakness (generalized): Secondary | ICD-10-CM | POA: Diagnosis not present

## 2019-04-13 DIAGNOSIS — M25561 Pain in right knee: Secondary | ICD-10-CM | POA: Diagnosis not present

## 2019-04-13 DIAGNOSIS — M25532 Pain in left wrist: Secondary | ICD-10-CM | POA: Diagnosis not present

## 2019-04-13 DIAGNOSIS — S52615K Nondisplaced fracture of left ulna styloid process, subsequent encounter for closed fracture with nonunion: Secondary | ICD-10-CM | POA: Diagnosis not present

## 2019-04-15 DIAGNOSIS — S52615K Nondisplaced fracture of left ulna styloid process, subsequent encounter for closed fracture with nonunion: Secondary | ICD-10-CM | POA: Diagnosis not present

## 2019-04-15 DIAGNOSIS — M25532 Pain in left wrist: Secondary | ICD-10-CM | POA: Diagnosis not present

## 2019-04-15 DIAGNOSIS — M25561 Pain in right knee: Secondary | ICD-10-CM | POA: Diagnosis not present

## 2019-04-15 DIAGNOSIS — M6281 Muscle weakness (generalized): Secondary | ICD-10-CM | POA: Diagnosis not present

## 2019-04-20 DIAGNOSIS — M25532 Pain in left wrist: Secondary | ICD-10-CM | POA: Diagnosis not present

## 2019-04-20 DIAGNOSIS — S52615K Nondisplaced fracture of left ulna styloid process, subsequent encounter for closed fracture with nonunion: Secondary | ICD-10-CM | POA: Diagnosis not present

## 2019-04-20 DIAGNOSIS — M6281 Muscle weakness (generalized): Secondary | ICD-10-CM | POA: Diagnosis not present

## 2019-04-27 DIAGNOSIS — S52615K Nondisplaced fracture of left ulna styloid process, subsequent encounter for closed fracture with nonunion: Secondary | ICD-10-CM | POA: Diagnosis not present

## 2019-04-27 DIAGNOSIS — M25532 Pain in left wrist: Secondary | ICD-10-CM | POA: Diagnosis not present

## 2019-04-27 DIAGNOSIS — M6281 Muscle weakness (generalized): Secondary | ICD-10-CM | POA: Diagnosis not present

## 2019-05-05 DIAGNOSIS — S52615K Nondisplaced fracture of left ulna styloid process, subsequent encounter for closed fracture with nonunion: Secondary | ICD-10-CM | POA: Diagnosis not present

## 2019-05-05 DIAGNOSIS — M25561 Pain in right knee: Secondary | ICD-10-CM | POA: Diagnosis not present

## 2019-05-05 DIAGNOSIS — M25532 Pain in left wrist: Secondary | ICD-10-CM | POA: Diagnosis not present

## 2019-05-05 DIAGNOSIS — M6281 Muscle weakness (generalized): Secondary | ICD-10-CM | POA: Diagnosis not present

## 2019-05-25 DIAGNOSIS — S52612K Displaced fracture of left ulna styloid process, subsequent encounter for closed fracture with nonunion: Secondary | ICD-10-CM | POA: Diagnosis not present

## 2019-06-28 DIAGNOSIS — Z23 Encounter for immunization: Secondary | ICD-10-CM | POA: Diagnosis not present

## 2019-07-29 DIAGNOSIS — Z20822 Contact with and (suspected) exposure to covid-19: Secondary | ICD-10-CM | POA: Diagnosis not present

## 2019-09-09 NOTE — Telephone Encounter (Signed)
Crystal Cochran rcvd/(charged per billing dept) 03/15/2019

## 2019-09-16 DIAGNOSIS — J029 Acute pharyngitis, unspecified: Secondary | ICD-10-CM | POA: Diagnosis not present

## 2019-10-02 DIAGNOSIS — Z20822 Contact with and (suspected) exposure to covid-19: Secondary | ICD-10-CM | POA: Diagnosis not present

## 2019-10-02 DIAGNOSIS — R05 Cough: Secondary | ICD-10-CM | POA: Diagnosis not present

## 2019-10-02 DIAGNOSIS — Z20828 Contact with and (suspected) exposure to other viral communicable diseases: Secondary | ICD-10-CM | POA: Diagnosis not present
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
## 2020-07-05 DIAGNOSIS — B354 Tinea corporis: Secondary | ICD-10-CM | POA: Diagnosis not present

## 2020-07-10 DIAGNOSIS — R519 Headache, unspecified: Secondary | ICD-10-CM | POA: Diagnosis not present

## 2020-07-19 DIAGNOSIS — L501 Idiopathic urticaria: Secondary | ICD-10-CM | POA: Diagnosis not present

## 2020-07-19 DIAGNOSIS — L299 Pruritus, unspecified: Secondary | ICD-10-CM | POA: Diagnosis not present

## 2020-07-20 DIAGNOSIS — L299 Pruritus, unspecified: Secondary | ICD-10-CM | POA: Diagnosis not present

## 2020-07-20 DIAGNOSIS — L501 Idiopathic urticaria: Secondary | ICD-10-CM | POA: Diagnosis not present

## 2020-07-27 DIAGNOSIS — L501 Idiopathic urticaria: Secondary | ICD-10-CM | POA: Diagnosis not present

## 2020-07-27 DIAGNOSIS — L299 Pruritus, unspecified: Secondary | ICD-10-CM | POA: Diagnosis not present

## 2020-08-04 ENCOUNTER — Encounter: Payer: Self-pay | Admitting: Obstetrics & Gynecology

## 2020-08-04 ENCOUNTER — Other Ambulatory Visit (HOSPITAL_COMMUNITY)
Admission: RE | Admit: 2020-08-04 | Discharge: 2020-08-04 | Disposition: A | Discharge status: Home / Self Care | Payer: Self-pay | Source: Ambulatory Visit | Attending: Obstetrics & Gynecology | Admitting: Obstetrics & Gynecology

## 2020-08-04 ENCOUNTER — Other Ambulatory Visit: Payer: Self-pay

## 2020-08-04 ENCOUNTER — Ambulatory Visit (INDEPENDENT_AMBULATORY_CARE_PROVIDER_SITE_OTHER): Payer: BC Managed Care – PPO | Admitting: Obstetrics & Gynecology

## 2020-08-04 VITALS — BP 110/70 | Ht 71.0 in | Wt 185.0 lb

## 2020-08-04 DIAGNOSIS — N926 Irregular menstruation, unspecified: Secondary | ICD-10-CM

## 2020-08-04 DIAGNOSIS — Z124 Encounter for screening for malignant neoplasm of cervix: Secondary | ICD-10-CM

## 2020-08-04 DIAGNOSIS — Z30432 Encounter for removal of intrauterine contraceptive device: Secondary | ICD-10-CM | POA: Diagnosis not present

## 2020-08-04 DIAGNOSIS — Z30016 Encounter for initial prescription of transdermal patch hormonal contraceptive device: Secondary | ICD-10-CM

## 2020-08-04 MED ORDER — XULANE 150-35 MCG/24HR TD PTWK: 1.0000 | MEDICATED_PATCH | TRANSDERMAL | 3 refills | Status: DC

## 2020-08-04 NOTE — Progress Notes (Signed)
Contraception Counseling Patient is a 20 year old WF who presents for contraception counseling. The patient has concerns over possible side effects to her Rutha Bouchard IUD placed 04/2019. She has had change in acne (more), weight gain (25 lbs), cramps intermittently, and post coital vaginal bleeding.  More recently, her periods have become more prolonged (albeit spotting).  The patient is sexually active. Pertinent past medical history: none.   Has tried OCPs in past, unsure as to daily pill use/ compliance.  PMHx: She  has no past medical history on file. Also,  has a past surgical history that includes Wrist surgery; Rigid esophagoscopy (N/A, 03/22/2019); and Foreign body removal esophageal (03/22/2019)., family history is not on file.,  reports that she has never smoked. She has never used smokeless tobacco. She reports current alcohol use. She reports that she does not use drugs.  She has a current medication list which includes the following prescription(s): xulane. Also, has No Known Allergies.  Review of Systems  Constitutional: Negative for chills, fever and malaise/fatigue.  HENT: Negative for congestion, sinus pain and sore throat.   Eyes: Negative for blurred vision and pain.  Respiratory: Negative for cough and wheezing.   Cardiovascular: Negative for chest pain and leg swelling.  Gastrointestinal: Negative for abdominal pain, constipation, diarrhea, heartburn, nausea and vomiting.  Genitourinary: Negative for dysuria, frequency, hematuria and urgency.  Musculoskeletal: Negative for back pain, joint pain, myalgias and neck pain.  Skin: Negative for itching and rash.  Neurological: Negative for dizziness, tremors and weakness.  Endo/Heme/Allergies: Does not bruise/bleed easily.  Psychiatric/Behavioral: Negative for depression. The patient is not nervous/anxious and does not have insomnia.     Objective: BP 110/70   Ht 5\' 11"  (1.803 m)   Wt 185 lb (83.9 kg)   BMI 25.80 kg/m   Physical Exam Constitutional:      General: She is not in acute distress.    Appearance: She is well-developed.  Genitourinary:     Vagina and uterus normal.     No vaginal erythema or bleeding.      Right Adnexa: not tender and no mass present.    Left Adnexa: not tender and no mass present.    No cervical motion tenderness, discharge, polyp or nabothian cyst.     IUD strings visualized.     Uterus is mobile.     Uterus is not enlarged.     No uterine mass detected.    Uterus is midaxial.     Pelvic exam was performed with patient supine.  HENT:     Head: Normocephalic and atraumatic.     Nose: Nose normal.  Abdominal:     General: There is no distension.     Palpations: Abdomen is soft.     Tenderness: There is no abdominal tenderness.  Musculoskeletal:        General: Normal range of motion.  Neurological:     Mental Status: She is alert and oriented to person, place, and time.     Cranial Nerves: No cranial nerve deficit.  Skin:    General: Skin is warm and dry.  Psychiatric:        Attention and Perception: Attention normal.        Mood and Affect: Mood and affect normal.        Speech: Speech normal.        Behavior: Behavior normal.        Thought Content: Thought content normal.        Judgment:  Judgment normal.     ASSESSMENT/PLAN:    Problem List Items Addressed This Visit   None   Visit Diagnoses    Irregular menses    -  Primary   Encounter for removal of intrauterine contraceptive device (IUD)       Encounter for initial prescription of transdermal patch hormonal contraceptive device       Relevant Medications   norelgestromin-ethinyl estradiol Burr Medico) 150-35 MCG/24HR transdermal patch   Screening for cervical cancer       Relevant Orders   Cytology - PAP    After discussion, plan is to have Xulane birth control patch therapy. OCPs and Patches The risks /benefits of OCPs have been explained to the patient in detail.  Product literature has  been given to her.  I have instructed her in the use of OCPs and have given her literature reinforcing this information.  I have explained to the patient that OCPs are not as effective for birth control during the first month of use, and that another form of contraception should be used during this time.  Both first-day start and Sunday start have been explained.  The risks and benefits of each was discussed.  She has been made aware of  the fact that other medications may affect the efficacy of OCPs.  I have answered all of her questions, and I believe that she has an understanding of the effectiveness and use of OCPs.  IUD Removal Pelvic exam:  Two IUD strings present seen coming from the cervical os. EGBUS, vaginal vault and cervix: within normal limits  Strings of IUD identified and grasped.  IUD removed without problem.  Pt tolerated this well.  IUD noted to be intact.  Annamarie Major, MD, [facility], [facility] 08/04/2020  1:53 PM

## 2020-08-04 NOTE — Patient Instructions (Signed)
Ethinyl Estradiol; Norelgestromin skin patches What is this medicine? ETHINYL ESTRADIOL;NORELGESTROMIN (ETH in il es tra DYE ole; nor el JES troe min) skin patch is used as a contraceptive (birth control method). This medicine combines two types of female hormones, an estrogen and a progestin. This patch is used to prevent ovulation and pregnancy. This medicine may be used for other purposes; ask your health care provider or pharmacist if you have questions. COMMON BRAND NAME(S): Ortho Evra, Xulane What should I tell my health care provider before I take this medicine? They need to know if you have or ever had any of these conditions:  abnormal vaginal bleeding  blood vessel disease or blood clots  breast, cervical, endometrial, ovarian, liver, or uterine cancer  diabetes  gallbladder disease  having surgery  heart disease or recent heart attack  high blood pressure  high cholesterol or triglycerides  history of irregular heartbeat or heart valve problems  kidney disease  liver disease  migraine headaches  protein C deficiency  protein S deficiency  recently had a baby, miscarriage, or abortion  stroke  systemic lupus erythematosus (SLE)  tobacco smoker  an unusual or allergic reaction to estrogens, progestins, other medicines, foods, dyes, or preservatives  pregnant or trying to get pregnant  breast-feeding How should I use this medicine? This patch is applied to the skin. Follow the directions on the prescription label. Apply to clean, dry, healthy skin on the buttock, abdomen, upper outer arm or upper torso, in a place where it will not be rubbed by tight clothing. Do not use lotions or other cosmetics on the site where the patch will go. Press the patch firmly in place for 10 seconds to ensure good contact with the skin. Change the patch every 7 days on the same day of the week for 3 weeks. You will then have a break from the patch for 1 week, after which you  will apply a new patch. Do not use your medicine more often than directed. Contact your pediatrician regarding the use of this medicine in children. Special care may be needed. This medicine has been used in female children who have started having menstrual periods. A patient package insert for the product will be given with each prescription and refill. Read this sheet carefully each time. The sheet may change frequently. Overdosage: If you think you have taken too much of this medicine contact a poison control center or emergency room at once. NOTE: This medicine is only for you. Do not share this medicine with others. What if I miss a dose? You will need to replace your patch once a week as directed. If your patch is lost or falls off, contact your health care professional for advice. You may need to use another form of birth control if your patch has been off for more than 1 day. What may interact with this medicine? Do not take this medicine with the following medications:  dasabuvir; ombitasvir; paritaprevir; ritonavir  ombitasvir; paritaprevir; ritonavir This medicine may also interact with the following medications:  acetaminophen  antibiotics or medicines for infections, especially rifampin, rifabutin, rifapentine, and possibly penicillins or tetracyclines  aprepitant or fosaprepitant  armodafinil  ascorbic acid (vitamin C)  barbiturate medicines, such as phenobarbital or primidone  bosentan  certain antiviral medicines for hepatitis, HIV or AIDS  certain medicines for cancer treatment  certain medicines for seizures like carbamazepine, clobazam, felbamate, lamotrigine, oxcarbazepine, phenytoin, rufinamide, topiramate  certain medicines for treating high cholesterol  cyclosporine    dantrolene  elagolix  flibanserin  grapefruit juice  lesinurad  medicines for diabetes  medicines to treat fungal infections, such as griseofulvin, miconazole, fluconazole,  ketoconazole, itraconazole, posaconazole or voriconazole  mifepristone  mitotane  modafinil  morphine  mycophenolate  St. John's wort  tamoxifen  temazepam  theophylline or aminophylline  thyroid hormones  tizanidine  tranexamic acid  ulipristal  warfarin This list may not describe all possible interactions. Give your health care provider a list of all the medicines, herbs, non-prescription drugs, or dietary supplements you use. Also tell them if you smoke, drink alcohol, or use illegal drugs. Some items may interact with your medicine. What should I watch for while using this medicine? Visit your doctor or health care professional for regular checks on your progress. You will need a regular breast and pelvic exam and Pap smear while on this medicine. Use an additional method of contraception during the first cycle that you use this patch. If you have any reason to think you are pregnant, stop using this medicine right away and contact your doctor or health care professional. If you are using this medicine for hormone related problems, it may take several cycles of use to see improvement in your condition. Smoking increases the risk of getting a blood clot or having a stroke while you are using hormonal birth control, especially if you are more than 21 years old. You are strongly advised not to smoke. This medicine can make your body retain fluid, making your fingers, hands, or ankles swell. Your blood pressure can go up. Contact your doctor or health care professional if you feel you are retaining fluid. This medicine can make you more sensitive to the sun. Keep out of the sun. If you cannot avoid being in the sun, wear protective clothing and use sunscreen. Do not use sun lamps or tanning beds/booths. If you wear contact lenses and notice visual changes, or if the lenses begin to feel uncomfortable, consult your eye care specialist. In some women, tenderness, swelling, or  minor bleeding of the gums may occur. Notify your dentist if this happens. Brushing and flossing your teeth regularly may help limit this. See your dentist regularly and inform your dentist of the medicines you are taking. If you are going to have elective surgery or a MRI, you may need to stop using this medicine before the surgery or MRI. Consult your health care professional for advice. This medicine does not protect you against HIV infection (AIDS) or any other sexually transmitted diseases. What side effects may I notice from receiving this medicine? Side effects that you should report to your doctor or health care professional as soon as possible:  allergic reactions such as skin rash or itching, hives, swelling of the lips, mouth, tongue, or throat  breast tissue changes or discharge  dark patches of skin on your forehead, cheeks, upper lip, and chin  depression  high blood pressure  migraines or severe, sudden headaches  missed menstrual periods  signs and symptoms of a blood clot such as breathing problems; changes in vision; chest pain; severe, sudden headache; pain, swelling, warmth in the leg; trouble speaking; sudden numbness or weakness of the face, arm or leg  skin reactions at the patch site such as blistering, bleeding, itching, rash, or swelling  stomach pain  yellowing of the eyes or skin Side effects that usually do not require medical attention (report these to your doctor or health care professional if they continue or are bothersome):    breast tenderness  irregular vaginal bleeding or spotting, particularly during the first 3 months of use  headache  nausea  painful menstrual periods  skin redness or mild irritation at site where applied  weight gain (slight) This list may not describe all possible side effects. Call your doctor for medical advice about side effects. You may report side effects to FDA at 1-800-FDA-1088. Where should I keep my  medicine? Keep out of the reach of children. Store at room temperature between 15 and 30 degrees C (59 and 86 degrees F). Keep the patch in its pouch until time of use. Throw away any unused medicine after the expiration date. Dispose of used patches properly. Since a used patch may still contain active hormones, fold the patch in half so that it sticks to itself prior to disposal. Throw away in a place where children or pets cannot reach. NOTE: This sheet is a summary. It may not cover all possible information. If you have questions about this medicine, talk to your doctor, pharmacist, or health care provider.  2021 Elsevier/Gold Standard (2018-09-22 11:56:29)  

## 2020-08-09 LAB — CYTOLOGY - PAP
Chlamydia: NEGATIVE
Comment: NEGATIVE
Comment: NORMAL
Diagnosis: NEGATIVE
Neisseria Gonorrhea: NEGATIVE

## 2020-09-17 DIAGNOSIS — H6691 Otitis media, unspecified, right ear: Secondary | ICD-10-CM | POA: Diagnosis not present

## 2020-10-10 IMAGING — CT CT NECK W/O CM
3 of 4 series · 11 of 33 positions shown, 13 images · non-contrast
Comparison: Earlier today

CLINICAL DATA: Ingested foreign body with possible esophageal
perforation on x-ray.

EXAM:
CT NECK WITHOUT CONTRAST
TECHNIQUE: Multidetector CT imaging of the neck was performed following the
standard protocol without intravenous contrast.

[Series 5: sag neck · sagittal · 0.41mm/px · 5 of 80 slices shown, 6 images]
[im 27/80  bone]
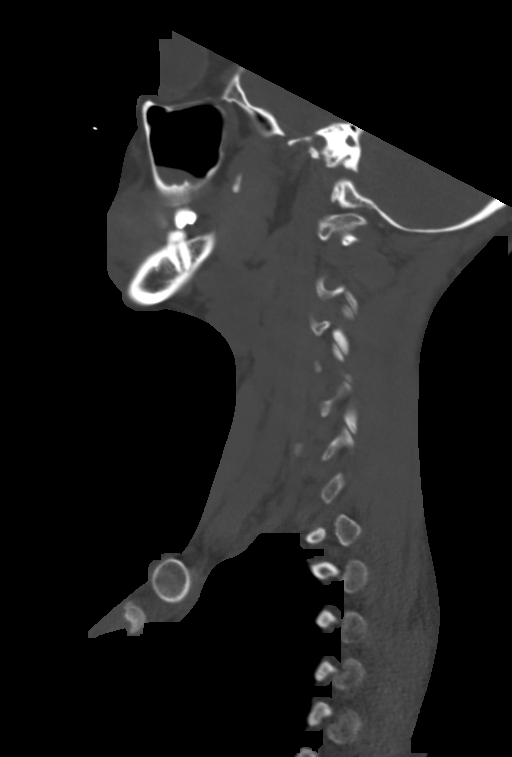
[im 33/80  bone]
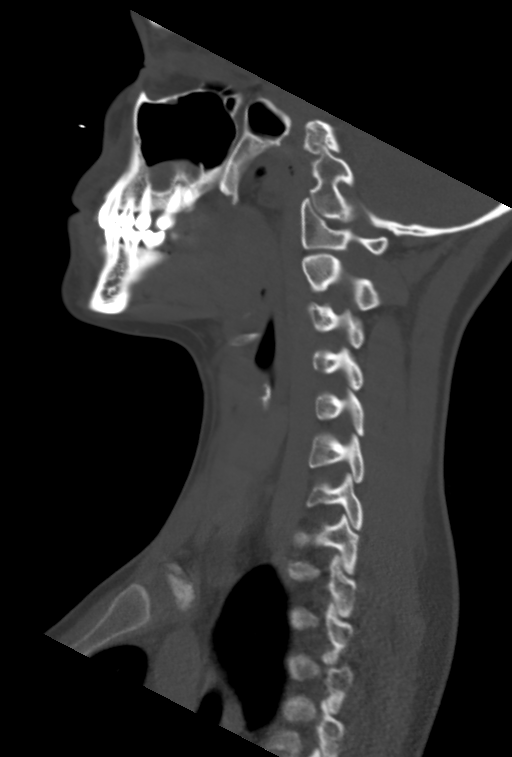
[im 40/80  soft-tissue]
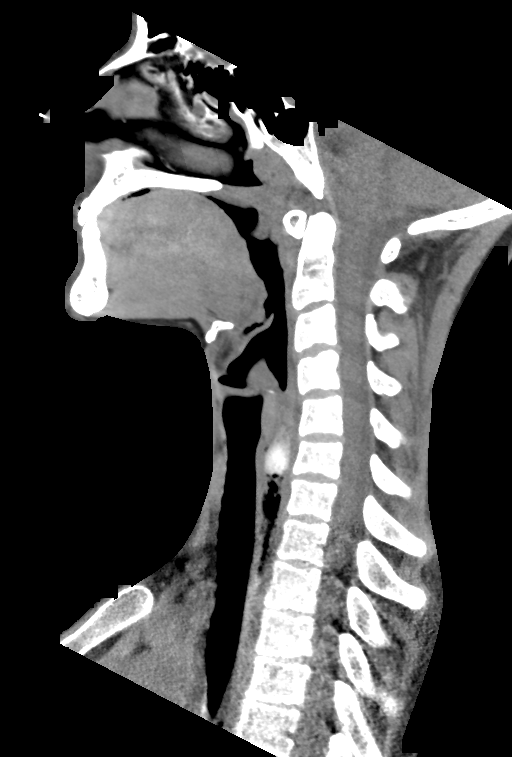
[im 40/80  bone]
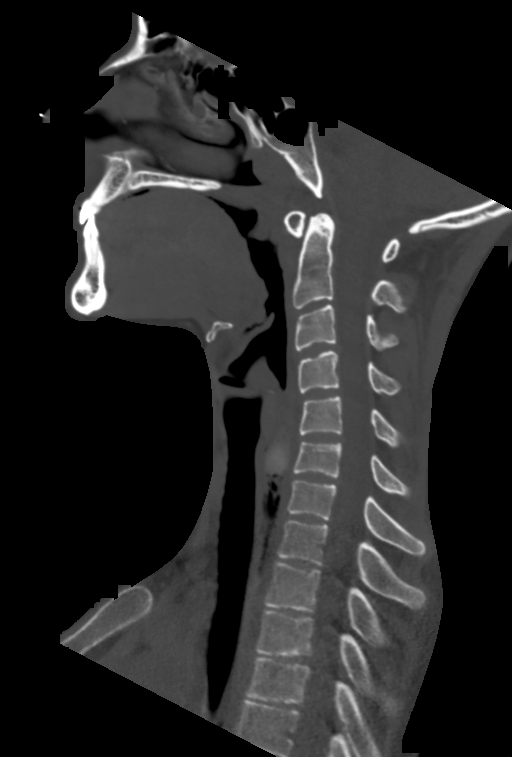
[im 47/80  bone]
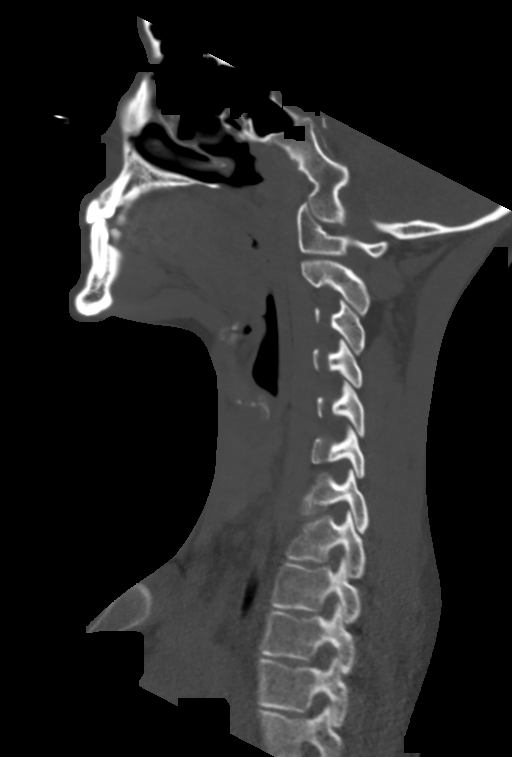
[im 53/80  bone]
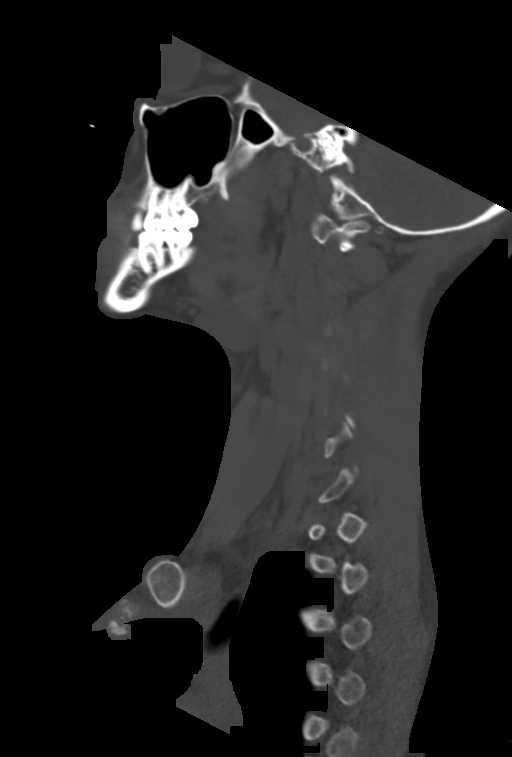

[Series 6: cor neck · coronal · 0.31mm/px · 3 of 87 slices shown]
[im 27/87  bone]
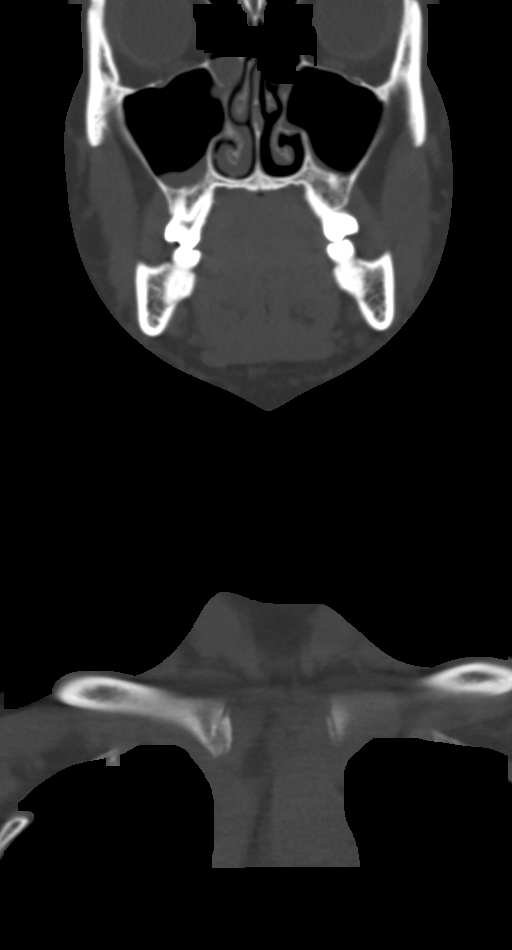
[im 38/87  bone]
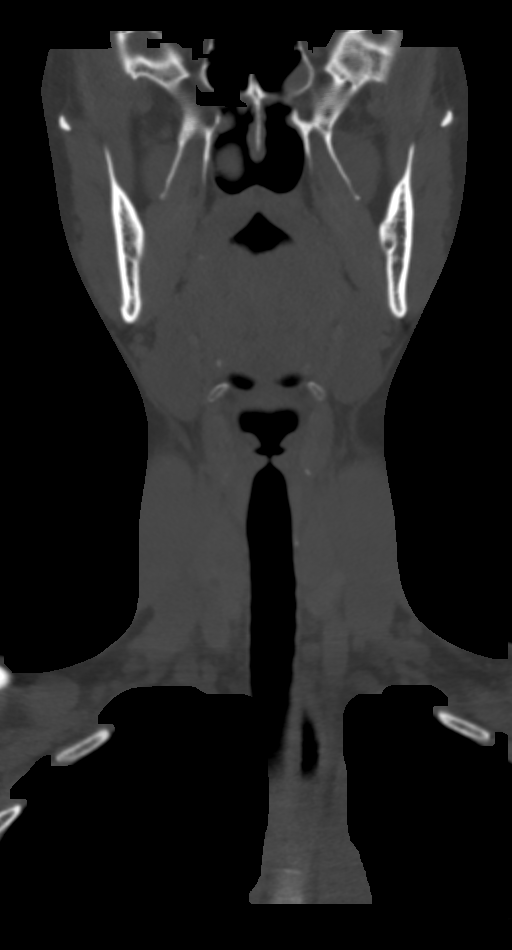
[im 49/87  bone]
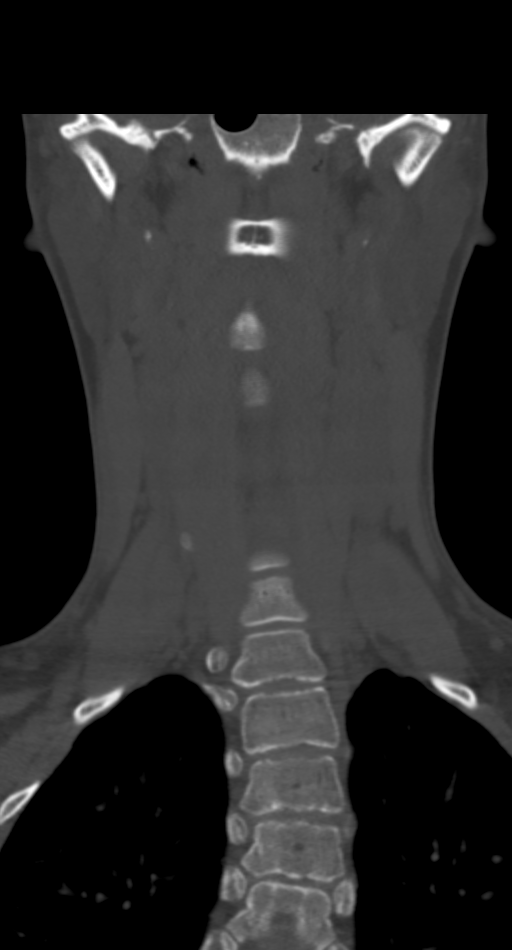

[Series 7: orthogonal ax · axial · 0.31mm/px · z∈[-269,-120]mm · 3 of 149 slices shown, 4 images]
[im 43/149  soft-tissue]
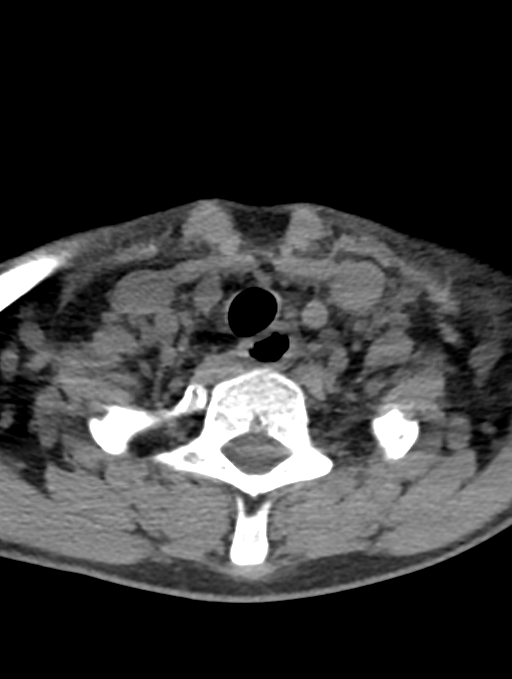
[im 43/149  bone]
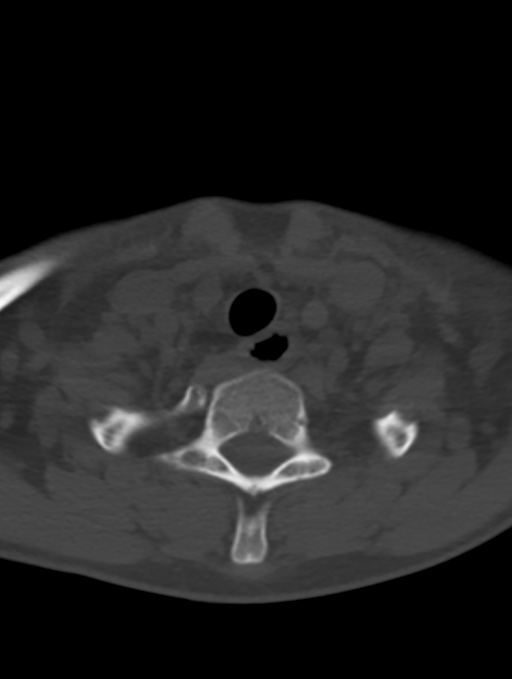
[im 85/149  bone]
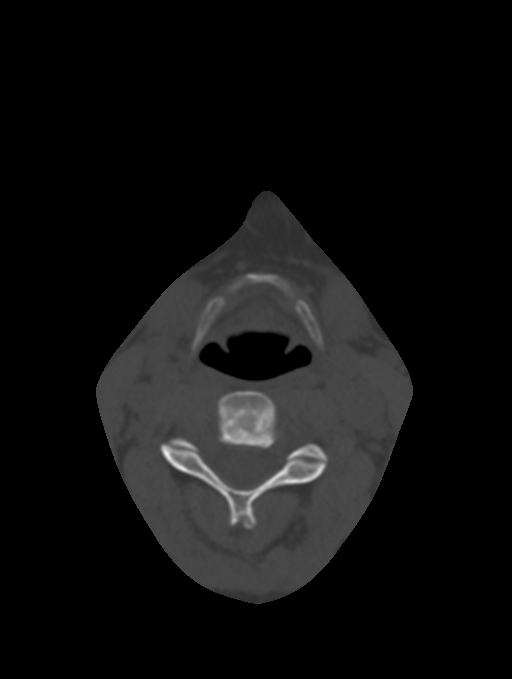
[im 127/149  bone]
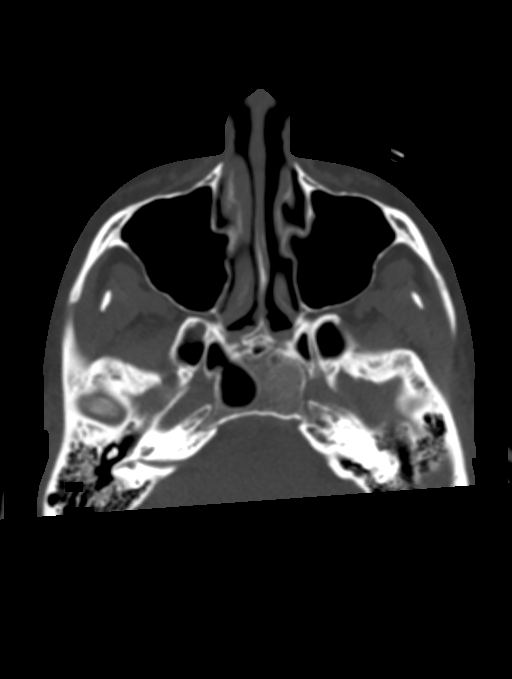

[11 of 33 positions shown; findings below may reference images not displayed]

FINDINGS: Pharynx and larynx: Lamellated high-density foreign body in the
upper cervical esophagus, just below the verge, measuring 17 x 19 x
7 mm. Location is un moved from prior radiograph. No perforation or
adjacent mural thickening. No edema seen within the pharynx or
larynx.

Salivary glands: Normal

Thyroid: Normal

Lymph nodes: None enlarged or abnormal density.

Vascular: Negative.

Limited intracranial: Negative.

Visualized orbits: Negative.

Mastoids and visualized paranasal sinuses: Minimal paranasal sinus
opacification at the level of the ethmoids.

Skeleton: Negative

Upper chest: Negative
IMPRESSION: 19 x 17 x 7 mm foreign body in the upper cervical esophagus without
perforation or esophageal thickening. The foreign body shows a
stone-like high-density and lamellated appearance.

## 2020-10-10 IMAGING — CR DG NECK SOFT TISSUE
2 series · 2 of 2 positions shown · non-contrast
Comparison: No prior.

CLINICAL DATA: Pill lodged in throat.

EXAM:
NECK SOFT TISSUES - 1+ VIEW

[neck lat]
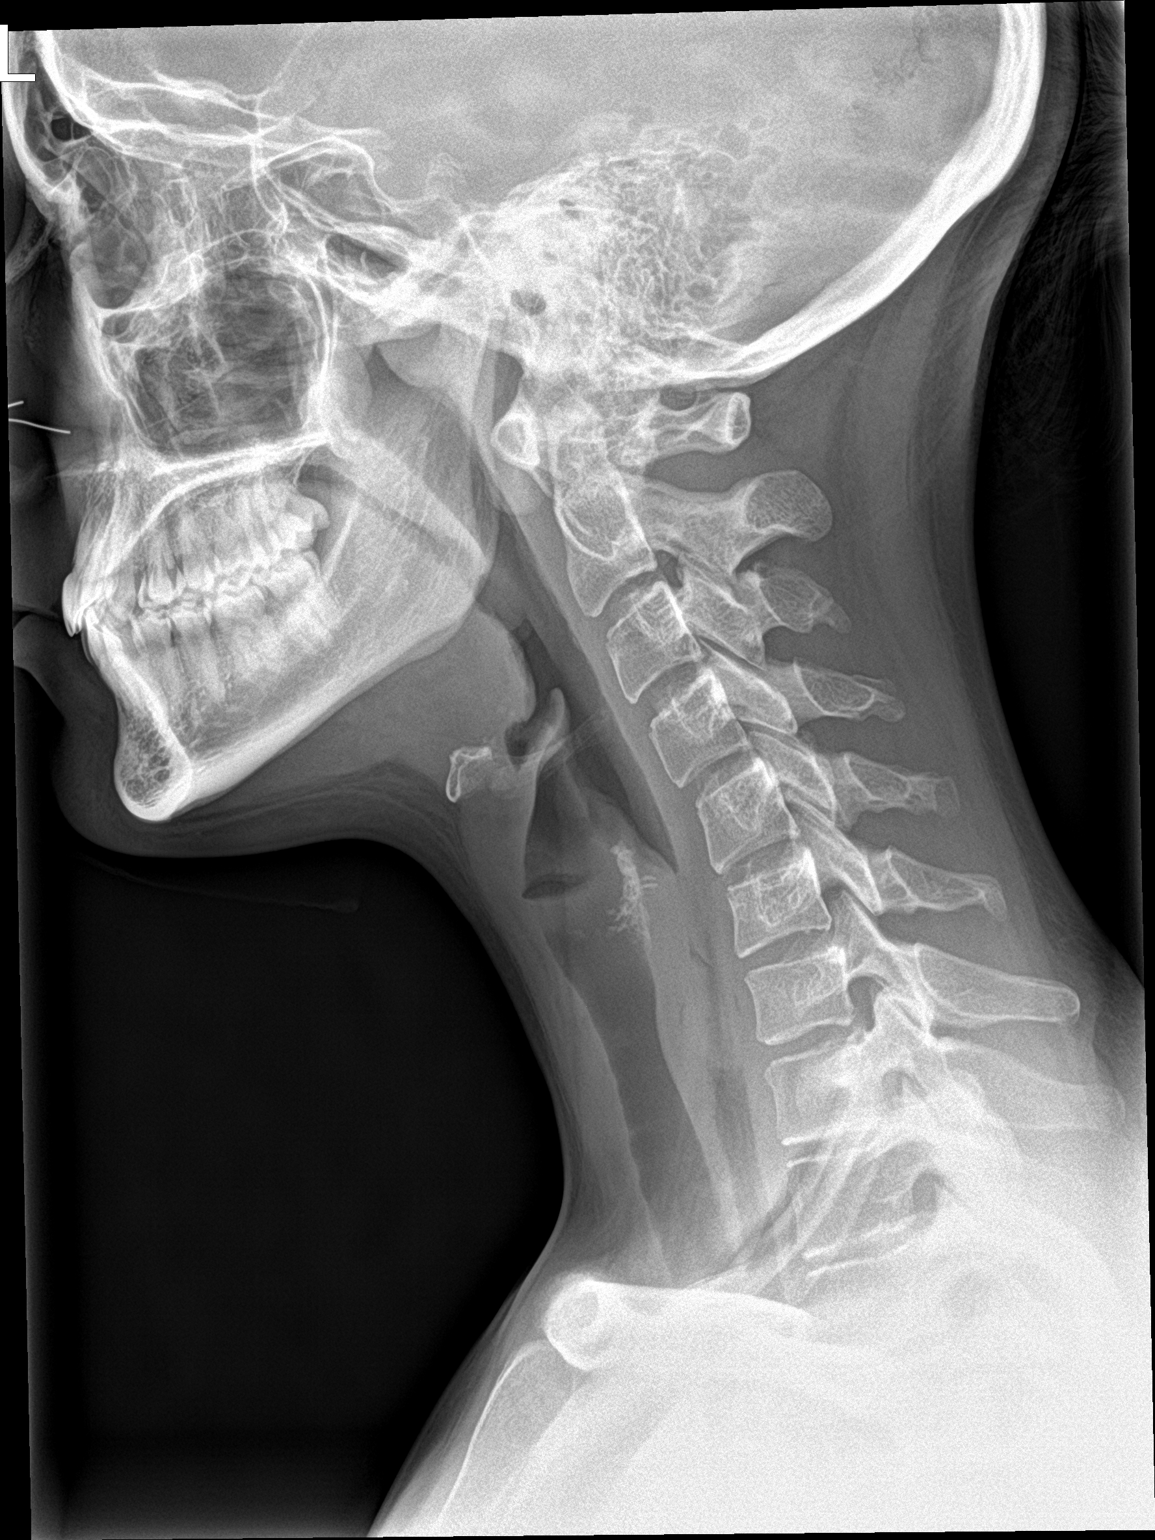

[neck ap]
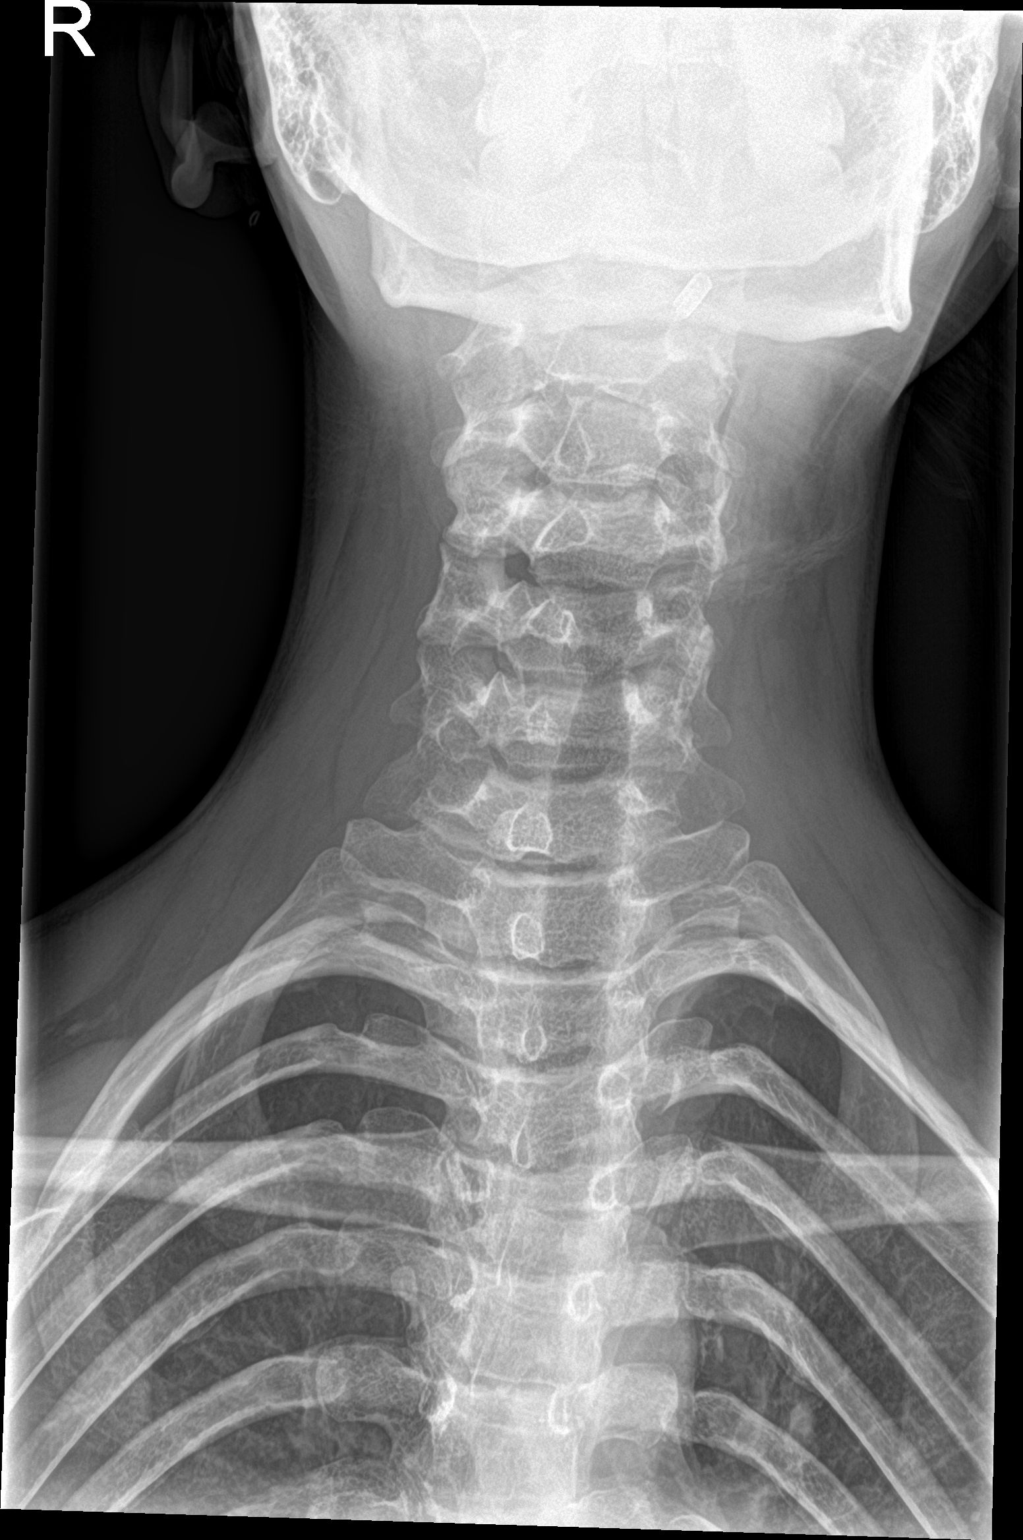

[2 of 2 positions shown; findings below may reference images not displayed]

FINDINGS: No swelling noted of the epiglottis or retropharyngeal space. No
radiopaque foreign body. Tiny amount of air noted about the cervical
esophagus, this may be within the esophagus. Paraesophageal air
cannot be excluded. Trachea is widely patent. Pulmonary apices are
clear. Loss of normal cervical lordosis. No acute bony abnormality.
IMPRESSION: 1.  No radiopaque foreign body.

2. Tiny amount of air noted about the cervical esophagus, although
this may be within the esophagus, paraesophageal air cannot be
excluded. To exclude cervical esophageal perforation nonenhanced
neck CT can be obtained.

These results were called by telephone at the time of interpretation
on 03/22/2019 at [DATE] to provider OLSPICOKA NAZARKO , who verbally
acknowledged these results.

## 2020-10-16 DIAGNOSIS — Z7189 Other specified counseling: Secondary | ICD-10-CM | POA: Diagnosis not present

## 2020-10-19 ENCOUNTER — Other Ambulatory Visit: Payer: Self-pay | Admitting: Obstetrics and Gynecology

## 2020-10-19 ENCOUNTER — Telehealth: Payer: Self-pay

## 2020-10-19 DIAGNOSIS — Z30016 Encounter for initial prescription of transdermal patch hormonal contraceptive device: Secondary | ICD-10-CM

## 2020-10-19 MED ORDER — XULANE 150-35 MCG/24HR TD PTWK: 1.0000 | MEDICATED_PATCH | TRANSDERMAL | 3 refills | Status: DC

## 2020-10-19 MED ORDER — XULANE 150-35 MCG/24HR TD PTWK: 1.0000 | MEDICATED_PATCH | TRANSDERMAL | 1 refills | Status: DC

## 2020-10-19 NOTE — Telephone Encounter (Signed)
Ok - I placed for 18 patches to be dispensed

## 2020-10-19 NOTE — Telephone Encounter (Signed)
Order placed. Thanks.

## 2020-10-19 NOTE — Telephone Encounter (Signed)
Pt aware.

## 2020-10-19 NOTE — Telephone Encounter (Signed)
Pt calling; is on 3rd month of xulane patch; needs enough refills to get her thru the summer as [place] is going [place].  [phone]

## 2020-11-28 ENCOUNTER — Other Ambulatory Visit: Payer: Self-pay

## 2020-11-28 DIAGNOSIS — Z30016 Encounter for initial prescription of transdermal patch hormonal contraceptive device: Secondary | ICD-10-CM

## 2020-11-28 MED ORDER — XULANE 150-35 MCG/24HR TD PTWK: 1.0000 | MEDICATED_PATCH | TRANSDERMAL | 1 refills | Status: DC

## 2020-11-28 NOTE — Telephone Encounter (Signed)
Pt calling; needs bc rx sent to pharm listed in [place]; is currently living there and not here.  [phone]  Left detailed msg ref eRx'd to [place] pharm.

## 2021-02-12 ENCOUNTER — Telehealth: Payer: BC Managed Care – PPO

## 2021-02-12 DIAGNOSIS — Z30016 Encounter for initial prescription of transdermal patch hormonal contraceptive device: Secondary | ICD-10-CM

## 2021-02-12 NOTE — Telephone Encounter (Signed)
Pt calling; has started back to college and needs [facility] [address] [facility] [address] in [place].  [phone]  [facility] and verify pharm [facility] [address] [facility]

## 2021-02-14 MED ORDER — XULANE 150-35 MCG/24HR TD PTWK: 1.0000 | MEDICATED_PATCH | TRANSDERMAL | 1 refills | Status: AC

## 2021-02-14 NOTE — Telephone Encounter (Signed)
Left second msg to call and verify pharm.

## 2021-02-14 NOTE — Telephone Encounter (Signed)
Pt called after hour nurse 02/13/21 6:58pm stating she got her pharn situation cleared up.  Left msg for pt that rx has been [facility] [address]

## 2021-03-09 DIAGNOSIS — D2261 Melanocytic nevi of right upper limb, including shoulder: Secondary | ICD-10-CM | POA: Diagnosis not present

## 2021-03-09 DIAGNOSIS — D225 Melanocytic nevi of trunk: Secondary | ICD-10-CM | POA: Diagnosis not present

## 2021-03-09 DIAGNOSIS — D2262 Melanocytic nevi of left upper limb, including shoulder: Secondary | ICD-10-CM | POA: Diagnosis not present

## 2021-03-09 DIAGNOSIS — L2089 Other atopic dermatitis: Secondary | ICD-10-CM | POA: Diagnosis not present

## 2021-05-04 ENCOUNTER — Telehealth: Payer: Self-pay

## 2021-05-04 NOTE — Telephone Encounter (Signed)
Patient needs refill of Xulane at [facility]. Unable to request thru my chart. Cb#[phone]

## 2021-05-04 NOTE — Telephone Encounter (Signed)
LMVM to advise patient to contact pharmacy for refill. Dispense: 18 patch  Refills: 1 ordered  Pharmacy: [id number] [facility], [place] - [address] (Ph: [phone])  Order Details Ordered on: 02/14/21

## 2021-09-03 DIAGNOSIS — L2089 Other atopic dermatitis: Secondary | ICD-10-CM | POA: Diagnosis not present

## 2021-10-24 DIAGNOSIS — L700 Acne vulgaris: Secondary | ICD-10-CM | POA: Diagnosis not present

## 2021-12-18 DIAGNOSIS — Z021 Encounter for pre-employment examination: Secondary | ICD-10-CM | POA: Diagnosis not present

## 2021-12-18 DIAGNOSIS — Z111 Encounter for screening for respiratory tuberculosis: Secondary | ICD-10-CM | POA: Diagnosis not present

## 2021-12-18 DIAGNOSIS — Z0184 Encounter for antibody response examination: Secondary | ICD-10-CM | POA: Diagnosis not present

## 2021-12-20 DIAGNOSIS — Z111 Encounter for screening for respiratory tuberculosis: Secondary | ICD-10-CM | POA: Diagnosis not present

## 2022-01-09 DIAGNOSIS — Z0184 Encounter for antibody response examination: Secondary | ICD-10-CM | POA: Diagnosis not present

## 2022-01-30 DIAGNOSIS — Z23 Encounter for immunization: Secondary | ICD-10-CM | POA: Diagnosis not present

## 2022-03-08 ENCOUNTER — Telehealth: Payer: Self-pay

## 2022-03-08 NOTE — Telephone Encounter (Signed)
[facility] [place], [place] [facility] of zulane patches.  Pt needs appt.

## 2022-03-12 DIAGNOSIS — Z23 Encounter for immunization: Secondary | ICD-10-CM | POA: Diagnosis not present

## 2022-03-12 DIAGNOSIS — Z0184 Encounter for antibody response examination: Secondary | ICD-10-CM | POA: Diagnosis not present

## 2022-03-12 DIAGNOSIS — Z681 Body mass index (BMI) 19 or less, adult: Secondary | ICD-10-CM | POA: Diagnosis not present

## 2022-03-13 ENCOUNTER — Other Ambulatory Visit: Payer: Self-pay

## 2022-03-13 NOTE — Telephone Encounter (Signed)
Pt left message needing a refill on her [facility] Patch. I left message to let her know Rx can not be refill as she has not been seen since 08/04/2020. She can find a new practice in [place] and they could carry on with her Refills.

## 2022-03-14 DIAGNOSIS — Z0184 Encounter for antibody response examination: Secondary | ICD-10-CM | POA: Diagnosis not present

## 2022-03-15 NOTE — Telephone Encounter (Signed)
I contacted patient via phone . Left voicemail for patient to call back to be scheduled.

## 2022-03-18 NOTE — Telephone Encounter (Signed)
Patient reports she is having trouble finding a provider in [place]. Advised can try health department or a local urgent care for rx until she is able to be seen by new provider.

## 2022-07-04 DIAGNOSIS — Z124 Encounter for screening for malignant neoplasm of cervix: Secondary | ICD-10-CM | POA: Diagnosis not present

## 2022-07-04 DIAGNOSIS — Z8349 Family history of other endocrine, nutritional and metabolic diseases: Secondary | ICD-10-CM | POA: Diagnosis not present

## 2022-07-04 DIAGNOSIS — Z0000 Encounter for general adult medical examination without abnormal findings: Secondary | ICD-10-CM | POA: Diagnosis not present

## 2022-07-04 DIAGNOSIS — Z13220 Encounter for screening for lipoid disorders: Secondary | ICD-10-CM | POA: Diagnosis not present

## 2022-07-12 DIAGNOSIS — N912 Amenorrhea, unspecified: Secondary | ICD-10-CM | POA: Diagnosis not present

## 2022-07-12 DIAGNOSIS — Z3009 Encounter for other general counseling and advice on contraception: Secondary | ICD-10-CM | POA: Diagnosis not present

## 2022-07-19 DIAGNOSIS — Z30430 Encounter for insertion of intrauterine contraceptive device: Secondary | ICD-10-CM | POA: Diagnosis not present

## 2022-07-19 DIAGNOSIS — Z1151 Encounter for screening for human papillomavirus (HPV): Secondary | ICD-10-CM | POA: Diagnosis not present

## 2022-07-19 DIAGNOSIS — R87612 Low grade squamous intraepithelial lesion on cytologic smear of cervix (LGSIL): Secondary | ICD-10-CM | POA: Diagnosis not present

## 2022-08-30 DIAGNOSIS — N912 Amenorrhea, unspecified: Secondary | ICD-10-CM | POA: Diagnosis not present

## 2022-09-02 DIAGNOSIS — Z30431 Encounter for routine checking of intrauterine contraceptive device: Secondary | ICD-10-CM | POA: Diagnosis not present

## 2022-09-12 DIAGNOSIS — N915 Oligomenorrhea, unspecified: Secondary | ICD-10-CM | POA: Diagnosis not present

## 2022-10-15 DIAGNOSIS — Z111 Encounter for screening for respiratory tuberculosis: Secondary | ICD-10-CM | POA: Diagnosis not present

## 2022-10-18 DIAGNOSIS — Z111 Encounter for screening for respiratory tuberculosis: Secondary | ICD-10-CM | POA: Diagnosis not present

## 2022-10-21 DIAGNOSIS — Z0279 Encounter for issue of other medical certificate: Secondary | ICD-10-CM | POA: Diagnosis not present

## 2022-10-21 DIAGNOSIS — Z681 Body mass index (BMI) 19 or less, adult: Secondary | ICD-10-CM | POA: Diagnosis not present

## 2022-10-27 DIAGNOSIS — R112 Nausea with vomiting, unspecified: Secondary | ICD-10-CM | POA: Diagnosis not present

## 2022-10-27 DIAGNOSIS — R531 Weakness: Secondary | ICD-10-CM | POA: Diagnosis not present

## 2022-10-27 DIAGNOSIS — E876 Hypokalemia: Secondary | ICD-10-CM | POA: Diagnosis not present

## 2022-10-27 DIAGNOSIS — R197 Diarrhea, unspecified: Secondary | ICD-10-CM | POA: Diagnosis not present

## 2023-01-29 DIAGNOSIS — N939 Abnormal uterine and vaginal bleeding, unspecified: Secondary | ICD-10-CM | POA: Diagnosis not present

## 2023-01-29 DIAGNOSIS — T8332XA Displacement of intrauterine contraceptive device, initial encounter: Secondary | ICD-10-CM | POA: Diagnosis not present

## 2023-01-29 DIAGNOSIS — Z30433 Encounter for removal and reinsertion of intrauterine contraceptive device: Secondary | ICD-10-CM | POA: Diagnosis not present

## 2023-02-16 DIAGNOSIS — Z23 Encounter for immunization: Secondary | ICD-10-CM | POA: Diagnosis not present

## 2023-03-24 DIAGNOSIS — E282 Polycystic ovarian syndrome: Secondary | ICD-10-CM | POA: Diagnosis not present

## 2023-03-24 DIAGNOSIS — Z30431 Encounter for routine checking of intrauterine contraceptive device: Secondary | ICD-10-CM | POA: Diagnosis not present

## 2023-05-21 DIAGNOSIS — Z0000 Encounter for general adult medical examination without abnormal findings: Secondary | ICD-10-CM | POA: Diagnosis not present

## 2023-05-21 DIAGNOSIS — Z8349 Family history of other endocrine, nutritional and metabolic diseases: Secondary | ICD-10-CM | POA: Diagnosis not present

## 2023-05-21 DIAGNOSIS — Z13220 Encounter for screening for lipoid disorders: Secondary | ICD-10-CM | POA: Diagnosis not present
